# Patient Record
Sex: Female | Born: 1950 | Race: White | Hispanic: No | Smoking: Never smoker
Health system: Southern US, Community
[De-identification: ages and names within clinical notes are randomized; demographics above are authoritative.]

## PROBLEM LIST (undated history)

## (undated) DIAGNOSIS — M549 Dorsalgia, unspecified: Secondary | ICD-10-CM

## (undated) DIAGNOSIS — E559 Vitamin D deficiency, unspecified: Secondary | ICD-10-CM

## (undated) DIAGNOSIS — I10 Essential (primary) hypertension: Secondary | ICD-10-CM

## (undated) DIAGNOSIS — E785 Hyperlipidemia, unspecified: Secondary | ICD-10-CM

## (undated) DIAGNOSIS — M797 Fibromyalgia: Secondary | ICD-10-CM

## (undated) DIAGNOSIS — E119 Type 2 diabetes mellitus without complications: Secondary | ICD-10-CM

## (undated) DIAGNOSIS — Z87442 Personal history of urinary calculi: Secondary | ICD-10-CM

## (undated) HISTORY — DX: Vitamin D deficiency, unspecified: E55.9

## (undated) HISTORY — DX: Type 2 diabetes mellitus without complications: E11.9

## (undated) HISTORY — DX: Personal history of urinary calculi: Z87.442

---

## 1998-06-27 ENCOUNTER — Ambulatory Visit (HOSPITAL_COMMUNITY): Admission: RE | Admit: 1998-06-27 | Discharge: 1998-06-27 | Payer: Self-pay | Admitting: *Deleted

## 1998-07-17 ENCOUNTER — Ambulatory Visit: Admission: RE | Admit: 1998-07-17 | Discharge: 1998-07-17 | Payer: Self-pay | Admitting: *Deleted

## 1998-08-02 ENCOUNTER — Other Ambulatory Visit: Admission: RE | Admit: 1998-08-02 | Discharge: 1998-08-02 | Payer: Self-pay | Admitting: *Deleted

## 1998-11-07 ENCOUNTER — Ambulatory Visit (HOSPITAL_COMMUNITY): Admission: RE | Admit: 1998-11-07 | Discharge: 1998-11-07 | Payer: Self-pay | Admitting: *Deleted

## 1998-11-22 ENCOUNTER — Ambulatory Visit (HOSPITAL_COMMUNITY): Admission: RE | Admit: 1998-11-22 | Discharge: 1998-11-22 | Payer: Self-pay | Admitting: Urology

## 1998-12-13 ENCOUNTER — Encounter: Payer: Self-pay | Admitting: Urology

## 1998-12-31 ENCOUNTER — Ambulatory Visit (HOSPITAL_COMMUNITY): Admission: RE | Admit: 1998-12-31 | Discharge: 1998-12-31 | Payer: Self-pay | Admitting: Urology

## 1999-07-01 ENCOUNTER — Ambulatory Visit (HOSPITAL_COMMUNITY): Admission: RE | Admit: 1999-07-01 | Discharge: 1999-07-01 | Payer: Self-pay | Admitting: *Deleted

## 1999-08-28 ENCOUNTER — Other Ambulatory Visit: Admission: RE | Admit: 1999-08-28 | Discharge: 1999-08-28 | Payer: Self-pay | Admitting: *Deleted

## 1999-12-03 ENCOUNTER — Ambulatory Visit (HOSPITAL_COMMUNITY): Admission: RE | Admit: 1999-12-03 | Discharge: 1999-12-03 | Payer: Self-pay | Admitting: *Deleted

## 2000-08-19 ENCOUNTER — Encounter: Admission: RE | Admit: 2000-08-19 | Discharge: 2000-08-19 | Payer: Self-pay | Admitting: *Deleted

## 2000-09-10 ENCOUNTER — Other Ambulatory Visit: Admission: RE | Admit: 2000-09-10 | Discharge: 2000-09-10 | Payer: Self-pay | Admitting: *Deleted

## 2001-09-14 ENCOUNTER — Other Ambulatory Visit: Admission: RE | Admit: 2001-09-14 | Discharge: 2001-09-14 | Payer: Self-pay | Admitting: Internal Medicine

## 2001-12-17 ENCOUNTER — Ambulatory Visit (HOSPITAL_COMMUNITY): Admission: RE | Admit: 2001-12-17 | Discharge: 2001-12-17 | Payer: Self-pay | Admitting: Gastroenterology

## 2002-09-27 ENCOUNTER — Encounter: Payer: Self-pay | Admitting: Internal Medicine

## 2002-09-27 ENCOUNTER — Ambulatory Visit (HOSPITAL_COMMUNITY): Admission: RE | Admit: 2002-09-27 | Discharge: 2002-09-27 | Payer: Self-pay | Admitting: Internal Medicine

## 2003-08-29 ENCOUNTER — Other Ambulatory Visit: Admission: RE | Admit: 2003-08-29 | Discharge: 2003-08-29 | Payer: Self-pay | Admitting: Obstetrics and Gynecology

## 2004-05-01 ENCOUNTER — Ambulatory Visit (HOSPITAL_COMMUNITY): Admission: RE | Admit: 2004-05-01 | Discharge: 2004-05-01 | Payer: Self-pay | Admitting: Internal Medicine

## 2005-01-20 ENCOUNTER — Encounter: Admission: RE | Admit: 2005-01-20 | Discharge: 2005-04-20 | Payer: Self-pay | Admitting: Internal Medicine

## 2005-09-25 ENCOUNTER — Other Ambulatory Visit: Admission: RE | Admit: 2005-09-25 | Discharge: 2005-09-25 | Payer: Self-pay | Admitting: Internal Medicine

## 2005-09-26 ENCOUNTER — Ambulatory Visit (HOSPITAL_COMMUNITY): Admission: RE | Admit: 2005-09-26 | Discharge: 2005-09-26 | Payer: Self-pay | Admitting: Internal Medicine

## 2006-10-07 ENCOUNTER — Encounter: Admission: RE | Admit: 2006-10-07 | Discharge: 2006-10-07 | Payer: Self-pay | Admitting: Internal Medicine

## 2006-10-20 ENCOUNTER — Encounter: Admission: RE | Admit: 2006-10-20 | Discharge: 2006-10-20 | Payer: Self-pay | Admitting: Internal Medicine

## 2006-10-30 ENCOUNTER — Encounter: Admission: RE | Admit: 2006-10-30 | Discharge: 2006-10-30 | Payer: Self-pay | Admitting: Internal Medicine

## 2006-10-30 ENCOUNTER — Encounter (INDEPENDENT_AMBULATORY_CARE_PROVIDER_SITE_OTHER): Payer: Self-pay | Admitting: Specialist

## 2006-11-16 ENCOUNTER — Ambulatory Visit: Payer: Self-pay | Admitting: Oncology

## 2006-11-27 ENCOUNTER — Encounter: Admission: RE | Admit: 2006-11-27 | Discharge: 2006-11-27 | Payer: Self-pay | Admitting: General Surgery

## 2007-01-15 ENCOUNTER — Ambulatory Visit: Payer: Self-pay | Admitting: Oncology

## 2007-10-27 ENCOUNTER — Encounter: Admission: RE | Admit: 2007-10-27 | Discharge: 2007-10-27 | Payer: Self-pay | Admitting: Internal Medicine

## 2008-04-05 ENCOUNTER — Encounter: Admission: RE | Admit: 2008-04-05 | Discharge: 2008-04-05 | Payer: Self-pay | Admitting: Orthopedic Surgery

## 2009-04-03 ENCOUNTER — Ambulatory Visit (HOSPITAL_COMMUNITY): Admission: RE | Admit: 2009-04-03 | Discharge: 2009-04-03 | Payer: Self-pay | Admitting: Internal Medicine

## 2009-05-28 ENCOUNTER — Other Ambulatory Visit: Admission: RE | Admit: 2009-05-28 | Discharge: 2009-05-28 | Payer: Self-pay | Admitting: Internal Medicine

## 2009-10-22 ENCOUNTER — Encounter: Admission: RE | Admit: 2009-10-22 | Discharge: 2009-10-22 | Payer: Self-pay | Admitting: Obstetrics and Gynecology

## 2009-11-09 ENCOUNTER — Emergency Department (HOSPITAL_COMMUNITY): Admission: EM | Admit: 2009-11-09 | Discharge: 2009-11-09 | Payer: Self-pay | Admitting: Emergency Medicine

## 2010-12-22 ENCOUNTER — Encounter: Payer: Self-pay | Admitting: Internal Medicine

## 2011-04-18 NOTE — Procedures (Signed)
[place]. [facility]  Patient:    [name], [name] Visit Number: [id number] MRN: [id number]          Service Type: END Location: ENDO Attending Physician:  Charna Elizabeth Dictated by:   Anselmo Rod, M.D. Proc. Date: 12/17/01 Admit Date:  12/17/2001 Discharge Date: 12/17/2001   CC:         Lovenia Kim, D.O.   Procedure Report  DATE OF BIRTH:  1951-11-27.  PROCEDURE:  Colonoscopy.  ENDOSCOPIST:  Anselmo Rod, M.D.  INSTRUMENT USED:  Olympus video colonoscope.  INDICATION FOR PROCEDURE:  Rectal bleeding with change in bowel habits in a 60 year old white female.  Rule out colonic polyps, masses, hemorrhoids, etc.  PREPROCEDURE PREPARATION:  Informed consent was procured from the patient. The patient was fasted for eight hours prior to the procedure and prepped with a bottle of magnesium citrate and a gallon of NuLytely the night prior to the procedure.  PREPROCEDURE PHYSICAL:  VITAL SIGNS:  The patient had stable vital signs.  NECK:  Supple.  CHEST:  Clear to auscultation.  S1, S2 regular.  ABDOMEN:  Soft with normal bowel sounds.  DESCRIPTION OF PROCEDURE:  The patient was placed in the left lateral decubitus position and sedated with 50 mg of Demerol and 5 mg of Versed intravenously.  Once the patient was adequately sedate and maintained on low-flow oxygen and continuous cardiac monitoring, the Olympus video colonoscope was advanced from the rectum to the cecum without difficulty.  The entire colonic mucosa appeared healthy and without lesions.  The appendiceal orifice and the ileocecal valve were clearly visualized and photographed.  No masses, polyps, erosions, ulcerations, or diverticula were seen.  Small internal hemorrhoids were appreciated on retroflexion in the rectum.  The patient tolerated the procedure well without complication.  IMPRESSION:  Normal colonoscopy except for small internal  hemorrhoid.  RECOMMENDATIONS: 1. A high-fiber diet has been advised for the patient. 2. Outpatient follow-up is advised in the next four to six weeks or earlier    if need be.  Further recommendations made at that time. Dictated by:   Anselmo Rod, M.D. Attending Physician:  Charna Elizabeth DD:  12/17/01 TD:  12/20/01 Job: [id number]

## 2011-09-08 ENCOUNTER — Other Ambulatory Visit (HOSPITAL_COMMUNITY): Payer: Medicare Other

## 2011-09-09 ENCOUNTER — Encounter (HOSPITAL_COMMUNITY)
Admission: RE | Admit: 2011-09-09 | Discharge: 2011-09-09 | Disposition: A | Discharge status: Home / Self Care | Payer: Medicare Other | Source: Ambulatory Visit | Attending: Orthopedic Surgery | Admitting: Orthopedic Surgery

## 2011-09-09 ENCOUNTER — Other Ambulatory Visit (HOSPITAL_COMMUNITY): Payer: Self-pay | Admitting: Orthopedic Surgery

## 2011-09-09 DIAGNOSIS — M549 Dorsalgia, unspecified: Secondary | ICD-10-CM

## 2011-09-09 LAB — TYPE AND SCREEN
ABO/RH(D): O POS
Antibody Screen: NEGATIVE

## 2011-09-09 LAB — BASIC METABOLIC PANEL
GFR calc Af Amer: >90 mL/min (ref >90)
GFR calc non Af Amer: 90 mL/min — ABNORMAL LOW (ref >90)
Glucose, Bld: 113 mg/dL — ABNORMAL HIGH (ref 70–99)
Potassium: 3.9 mEq/L (ref 3.5–5.1)
Sodium: 140 mEq/L (ref 135–145)

## 2011-09-09 LAB — CBC
Hemoglobin: 12 g/dL (ref 12.0–15.0)
MCHC: 31.6 g/dL (ref 30.0–36.0)
Platelets: 266 10*3/uL (ref 150–400)

## 2011-09-09 LAB — ABO/RH: ABO/RH(D): O POS

## 2011-09-10 LAB — HEPATIC FUNCTION PANEL
ALT: 17 U/L (ref 0–35)
Alkaline Phosphatase: 83 U/L (ref 39–117)
Bilirubin, Direct: <0.1 mg/dL (ref 0.0–0.3)
Total Bilirubin: 0.5 mg/dL (ref 0.3–1.2)

## 2011-09-11 ENCOUNTER — Inpatient Hospital Stay (HOSPITAL_COMMUNITY)
Admission: RE | Admit: 2011-09-11 | Discharge: 2011-09-19 | DRG: 460 | Disposition: A | Discharge status: Skilled Nursing Facility | Payer: Medicare Other | Source: Ambulatory Visit | Attending: Orthopedic Surgery | Admitting: Orthopedic Surgery

## 2011-09-11 ENCOUNTER — Inpatient Hospital Stay (HOSPITAL_COMMUNITY): Payer: Medicare Other

## 2011-09-11 DIAGNOSIS — K219 Gastro-esophageal reflux disease without esophagitis: Secondary | ICD-10-CM | POA: Diagnosis present

## 2011-09-11 DIAGNOSIS — T4275XA Adverse effect of unspecified antiepileptic and sedative-hypnotic drugs, initial encounter: Secondary | ICD-10-CM | POA: Diagnosis not present

## 2011-09-11 DIAGNOSIS — Z7982 Long term (current) use of aspirin: Secondary | ICD-10-CM

## 2011-09-11 DIAGNOSIS — IMO0001 Reserved for inherently not codable concepts without codable children: Secondary | ICD-10-CM | POA: Diagnosis present

## 2011-09-11 DIAGNOSIS — R7989 Other specified abnormal findings of blood chemistry: Secondary | ICD-10-CM | POA: Diagnosis not present

## 2011-09-11 DIAGNOSIS — E119 Type 2 diabetes mellitus without complications: Secondary | ICD-10-CM | POA: Diagnosis present

## 2011-09-11 DIAGNOSIS — Z01818 Encounter for other preprocedural examination: Secondary | ICD-10-CM

## 2011-09-11 DIAGNOSIS — M129 Arthropathy, unspecified: Secondary | ICD-10-CM | POA: Diagnosis present

## 2011-09-11 DIAGNOSIS — R4182 Altered mental status, unspecified: Secondary | ICD-10-CM | POA: Diagnosis not present

## 2011-09-11 DIAGNOSIS — Q762 Congenital spondylolisthesis: Secondary | ICD-10-CM

## 2011-09-11 DIAGNOSIS — E785 Hyperlipidemia, unspecified: Secondary | ICD-10-CM | POA: Diagnosis present

## 2011-09-11 DIAGNOSIS — Z79899 Other long term (current) drug therapy: Secondary | ICD-10-CM

## 2011-09-11 DIAGNOSIS — I10 Essential (primary) hypertension: Secondary | ICD-10-CM | POA: Diagnosis present

## 2011-09-11 DIAGNOSIS — M48061 Spinal stenosis, lumbar region without neurogenic claudication: Principal | ICD-10-CM | POA: Diagnosis present

## 2011-09-11 DIAGNOSIS — D649 Anemia, unspecified: Secondary | ICD-10-CM | POA: Diagnosis present

## 2011-09-11 DIAGNOSIS — IMO0002 Reserved for concepts with insufficient information to code with codable children: Secondary | ICD-10-CM

## 2011-09-11 DIAGNOSIS — Z01812 Encounter for preprocedural laboratory examination: Secondary | ICD-10-CM

## 2011-09-11 LAB — URINE MICROSCOPIC-ADD ON

## 2011-09-11 LAB — DIFFERENTIAL
Basophils Absolute: 0 10*3/uL (ref 0.0–0.1)
Basophils Relative: 0 % (ref 0–1)
Eosinophils Absolute: 0.1 10*3/uL (ref 0.0–0.7)
Eosinophils Relative: 2 % (ref 0–5)
Monocytes Absolute: 0.6 10*3/uL (ref 0.1–1.0)

## 2011-09-11 LAB — PROTIME-INR: INR: 1.02 (ref 0.00–1.49)

## 2011-09-11 LAB — URINALYSIS, ROUTINE W REFLEX MICROSCOPIC
Glucose, UA: NEGATIVE mg/dL
Hgb urine dipstick: NEGATIVE
Ketones, ur: 15 mg/dL — AB
Protein, ur: NEGATIVE mg/dL
Urobilinogen, UA: 1 mg/dL (ref 0.0–1.0)

## 2011-09-11 LAB — CBC
Hemoglobin: 11 g/dL — ABNORMAL LOW (ref 12.0–15.0)
MCH: 27.3 pg (ref 26.0–34.0)
MCHC: 31.6 g/dL (ref 30.0–36.0)
RDW: 14.9 % (ref 11.5–15.5)

## 2011-09-11 LAB — GLUCOSE, CAPILLARY

## 2011-09-11 LAB — APTT: aPTT: 30 seconds (ref 24–37)

## 2011-09-12 LAB — GLUCOSE, CAPILLARY
Glucose-Capillary: 167 mg/dL — ABNORMAL HIGH (ref 70–99)
Glucose-Capillary: 150 mg/dL — ABNORMAL HIGH (ref 70–99)

## 2011-09-13 LAB — GLUCOSE, CAPILLARY
Glucose-Capillary: 155 mg/dL — ABNORMAL HIGH (ref 70–99)
Glucose-Capillary: 156 mg/dL — ABNORMAL HIGH (ref 70–99)

## 2011-09-14 ENCOUNTER — Inpatient Hospital Stay (HOSPITAL_COMMUNITY): Payer: Medicare Other

## 2011-09-14 LAB — COMPREHENSIVE METABOLIC PANEL
ALT: 973 U/L — ABNORMAL HIGH (ref 0–35)
AST: 2073 U/L — ABNORMAL HIGH (ref 0–37)
Albumin: 2.8 g/dL — ABNORMAL LOW (ref 3.5–5.2)
Albumin: 2.8 g/dL — ABNORMAL LOW (ref 3.5–5.2)
Alkaline Phosphatase: 65 U/L (ref 39–117)
Alkaline Phosphatase: 64 U/L (ref 39–117)
BUN: 26 mg/dL — ABNORMAL HIGH (ref 6–23)
BUN: 25 mg/dL — ABNORMAL HIGH (ref 6–23)
Chloride: 100 mEq/L (ref 96–112)
Chloride: 101 mEq/L (ref 96–112)
Potassium: 3.8 mEq/L (ref 3.5–5.1)
Potassium: 3.4 mEq/L — ABNORMAL LOW (ref 3.5–5.1)
Total Bilirubin: 0.4 mg/dL (ref 0.3–1.2)
Total Bilirubin: 0.5 mg/dL (ref 0.3–1.2)

## 2011-09-14 LAB — CBC
HCT: 29.4 % — ABNORMAL LOW (ref 36.0–46.0)
Hemoglobin: 9.1 g/dL — ABNORMAL LOW (ref 12.0–15.0)
RDW: 15.1 % (ref 11.5–15.5)
WBC: 8.3 10*3/uL (ref 4.0–10.5)

## 2011-09-14 LAB — URINALYSIS, ROUTINE W REFLEX MICROSCOPIC
Glucose, UA: NEGATIVE mg/dL
Ketones, ur: 15 mg/dL — AB
Protein, ur: 30 mg/dL — AB

## 2011-09-14 LAB — URINE MICROSCOPIC-ADD ON

## 2011-09-14 LAB — GLUCOSE, CAPILLARY
Glucose-Capillary: 165 mg/dL — ABNORMAL HIGH (ref 70–99)
Glucose-Capillary: 174 mg/dL — ABNORMAL HIGH (ref 70–99)
Glucose-Capillary: 136 mg/dL — ABNORMAL HIGH (ref 70–99)

## 2011-09-15 ENCOUNTER — Inpatient Hospital Stay (HOSPITAL_COMMUNITY): Payer: Medicare Other

## 2011-09-15 LAB — COMPREHENSIVE METABOLIC PANEL
BUN: 22 mg/dL (ref 6–23)
CO2: 32 mEq/L (ref 19–32)
Chloride: 100 mEq/L (ref 96–112)
Creatinine, Ser: 0.63 mg/dL (ref 0.50–1.10)
GFR calc Af Amer: >90 mL/min (ref >90)
GFR calc non Af Amer: >90 mL/min (ref >90)
Total Bilirubin: 0.6 mg/dL (ref 0.3–1.2)

## 2011-09-15 LAB — IGM: IgM, Serum: 379 mg/dL — ABNORMAL HIGH (ref 52–322)

## 2011-09-15 LAB — ANTI-SMOOTH MUSCLE ANTIBODY, IGG: F-Actin IgG: 2 U (ref <20)

## 2011-09-15 LAB — URINE CULTURE

## 2011-09-15 LAB — IRON AND TIBC: UIBC: 221 ug/dL (ref 125–400)

## 2011-09-15 LAB — ANA: Anti Nuclear Antibody(ANA): NEGATIVE

## 2011-09-15 LAB — GLUCOSE, CAPILLARY: Glucose-Capillary: 150 mg/dL — ABNORMAL HIGH (ref 70–99)

## 2011-09-15 LAB — HEPATITIS PANEL, ACUTE: Hep A IgM: NEGATIVE

## 2011-09-16 LAB — COMPREHENSIVE METABOLIC PANEL
ALT: 1285 U/L — ABNORMAL HIGH (ref 0–35)
Albumin: 2.8 g/dL — ABNORMAL LOW (ref 3.5–5.2)
Alkaline Phosphatase: 70 U/L (ref 39–117)
Potassium: 3.2 mEq/L — ABNORMAL LOW (ref 3.5–5.1)
Sodium: 140 mEq/L (ref 135–145)
Total Protein: 6 g/dL (ref 6.0–8.3)

## 2011-09-16 LAB — PROTIME-INR
INR: 1.18 (ref 0.00–1.49)
Prothrombin Time: 15.3 seconds — ABNORMAL HIGH (ref 11.6–15.2)

## 2011-09-16 LAB — MITOCHONDRIAL ANTIBODIES: Mitochondrial M2 Ab, IgG: 0.09 (ref <0.91)

## 2011-09-17 LAB — COMPREHENSIVE METABOLIC PANEL
AST: 426 U/L — ABNORMAL HIGH (ref 0–37)
Alkaline Phosphatase: 68 U/L (ref 39–117)
BUN: 14 mg/dL (ref 6–23)
CO2: 29 mEq/L (ref 19–32)
Chloride: 103 mEq/L (ref 96–112)
Creatinine, Ser: 0.65 mg/dL (ref 0.50–1.10)
GFR calc non Af Amer: >90 mL/min (ref >90)
Potassium: 3.6 mEq/L (ref 3.5–5.1)
Total Bilirubin: 0.7 mg/dL (ref 0.3–1.2)

## 2011-09-18 LAB — GLUCOSE, CAPILLARY
Glucose-Capillary: 87 mg/dL (ref 70–99)
Glucose-Capillary: 93 mg/dL (ref 70–99)
Glucose-Capillary: 138 mg/dL — ABNORMAL HIGH (ref 70–99)

## 2011-09-18 LAB — COMPREHENSIVE METABOLIC PANEL
Albumin: 2.7 g/dL — ABNORMAL LOW (ref 3.5–5.2)
Alkaline Phosphatase: 69 U/L (ref 39–117)
BUN: 15 mg/dL (ref 6–23)
Calcium: 10.1 mg/dL (ref 8.4–10.5)
Creatinine, Ser: 0.7 mg/dL (ref 0.50–1.10)
GFR calc Af Amer: >90 mL/min (ref >90)
Potassium: 3.9 mEq/L (ref 3.5–5.1)
Total Protein: 5.8 g/dL — ABNORMAL LOW (ref 6.0–8.3)

## 2011-09-18 NOTE — Op Note (Signed)
[name], [name]               ACCOUNT NO.:  [id number]  MEDICAL RECORD NO.:  [id number]  LOCATION:  5003                         FACILITY:  [facility]  PHYSICIAN:  Estill Bamberg, MD      DATE OF BIRTH:  [birth year]  DATE OF PROCEDURE:  09/11/2011 DATE OF DISCHARGE:                              OPERATIVE REPORT   PREOPERATIVE DIAGNOSES: 1. L4-L5 spinal stenosis causing right-sided leg pain. 2. L4-L5 grade 1 spondylolisthesis.  POSTOPERATIVE DIAGNOSES: 1. L4-L5 spinal stenosis causing right-sided leg pain. 2. L4-L5 grade 1 spondylolisthesis.  PROCEDURES: 1. Right-sided transforaminal lumbar interbody fusion, L4-L5. 2. Left-sided posterolateral fusion, L4-L5. 3. Placement of posterior instrumentation, L4, L5. 4. Insertion of interbody device, L4-L5 (10-mm lordotic Concorde     Bullet cage). 5. Use of local autograft. 6. Bone marrow aspiration from a separate incision from the patient's     left iliac crest. 7. Intraoperative use of fluoroscopy.  SURGEON:  Estill Bamberg, MD  ASSISTANTS:  Laural Benes. Su Hilt, [place]  ANESTHESIA:  General endotracheal anesthesia.  COMPLICATIONS:  None.  DISPOSITION:  Stable.  ESTIMATED BLOOD LOSS:  300 mL.  INDICATIONS FOR PROCEDURE:  Briefly, [name] is an extremely pleasant 60 year old female who initially presented to my office on August 01, 2011 with a 4-year history of back pain and 1-year history of pain in the right leg.  The patient's pain was in the distribution of the L5 nerve.  The patient did have an MRI which was notable for spinal stenosis at the L4-L5 level with disk protrusion which was likely causing compression of the traversing L5 nerve.  The patient did have an L5 selective nerve root block and she did state that this did temporarily alleviate 100% of her symptoms.  Given her failure of extensive conservative care, we did have a discussion regarding going forward with a right-sided L4-L5 transforaminal lumbar interbody  fusion as well as remainder of the procedure noted above.  The patient fully understood the risks and limitations of the procedure as outlined in my preoperative note.  OPERATIVE DETAILS:  On September 11, 2011, the patient was brought to Surgery and general endotracheal anesthesia was administered.  The patient was placed prone on a well-padded time flat Jackson bed with a Wilson frame.  All bony prominences were meticulously padded.  SCDs were placed and antibiotics were given.  A time-out procedure was performed. The back was then prepped and draped in the usual sterile fashion.  I did place two 18-gauge spinal needles over the midline approximately at the L4-L5 level and a lateral intraoperative radiograph was obtained in order to identify the appropriate trajectory of the pedicle screws and to help optimize location of the incision.  I then made an incision from approximately spinous process of L3 to approximately spinous process of L5.  The fascia was sharply incised.  The paraspinal musculature was gently swept laterally.  The posterolateral gutters were thoroughly exposed out to the transverse processes of L4 and L5.  Of note, the lateral fluoroscopy was brought in which did confirm the appropriate operative level.  Once the posterolateral gutters were fully exposed, I went forward with cannulating the pedicles of L4 and L5 on  the right side.  This was done using a 4-mm bur followed by curved gearshift probe followed by a ball-tipped probe and a 6-mm tap.  A ball-tip probe was again utilized to confirm no cortical violation of the pedicles.  Bone wax was placed.  This was done on the right side and then on the left. AP and lateral fluoroscopy was used throughout to help optimize the location of the screw positioning.  The posterolateral gutters were then packed and I turned my attention towards the decompressive aspect of the procedure.  I did use an osteotome to entirely remove  the inferior articular process of L4.  The ligamentum flavum was teased away and was removed using a series of Kerrisons and the superior articular process of L5 was also removed.  The L4-L5 intervertebral disk was readily identified.  There was epidural bleeding encountered which was controlled using Gelfoam, thrombin as well as bipolar electrocautery. The traversing L5 nerve root was gently swept medially and the exiting L4 nerve was identified and retracted superiorly.  With an assistant holding medial retraction of the traversing L5 nerve, I did use a #15 blade knife to perform an annulotomy at the posterolateral aspect of the disk.  I then went forward with a thorough and meticulous diskectomy using a series of curettes and pituitary rongeurs.  I was very pleased with the final diskectomy.  I then went forward placing a series of interbody trials.  I did feel that a 10-mm lordotic trial would be the most appropriate fit.  At this point, the retractors were removed and the wound was copiously irrigated.  I then turned my attention towards the patient's left iliac crest and 7 mL of bone marrow aspirate was obtained through a separate incision.  This was mixed with 10 mL aliquot of Vitoss BA.  Autograft obtained from removing the facet was cleaned and broken into small pieces.  The autograft and Vitoss mixture was mixed and packed into the interbody device.  With distraction applied using a lamina spreader, I did tamped a 10-mm Bullet cage into the interbody space.  AP and lateral fluoroscopy was utilized to confirm the appropriate position.  Of note, prior to placing the interbody device, I did thoroughly packed the interbody space with approximately 4 mL of the Vitoss BA/bone marrow aspirate/autograft mixture.  I was very happy with the final press-fit of the interbody cage.  All of the dural bleeding was again identified and controlled.  I then placed 7 x 45 mm screws on the right  side.  Triggered EMG was utilized to test the screws and there was no motor-evoked potential less 25 mA.  A 35-mm rod was placed. Final to putting caps, the Wilson frame was cranked down, so as to optimize the amount of lordosis.  A 35-mm rod was then placed and caps were placed.  A compression maneuver was placed across the 35-mm rod and a final locking procedure was performed.  At this point again, the wound was copiously irrigated with approximately 1 L of normal saline.  I then turned my attention towards the patient's left side.  I did use a 4-mm bur to entirely and thoroughly decorticate the posterior elements of L4 and L5 in addition to the facet joint in addition to the transverse processes.  The remainder of the Vitoss BA/bone marrow aspirate/autograft mixture was placed along the posterior elements and posterolateral gutter.  The 7 x 45 mm screws were again placed and were again tested.  There  was no triggered EMG response less than 25 mA.  A 35-mm rod was placed and caps were then applied.  A compression maneuver was placed across the rod and a final locking procedure was performed. I then again obtained AP and lateral fluoroscopic views and I was extremely pleased with the appearance of the final construct.  I then again explored the epidural space and all epidural superficial and deep bleeding was entirely controlled.  A drain was not placed.  I then used #1 Vicryl to close the fascia.  The subcutaneous layer was closed using 2-0 Vicryl and the skin was closed using 3-0 Monocryl.  The incision overlying the iliac crest, bone marrow harvest area was also irrigated and closed using 3-0 Monocryl.  All instrument counts were correct at the termination of the procedure.  Of note, Skip Mayer was my assistant throughout the procedure and aided in essential retraction and suctioning required throughout the surgery.     Estill Bamberg, MD     MD/MEDQ  D:  09/11/2011  T:   09/11/2011  Job:  [id number]  cc:   Lovenia Kim, D.O.  Electronically Signed by Estill Bamberg  on 09/18/2011 12:19:21 PM

## 2011-09-19 LAB — COMPREHENSIVE METABOLIC PANEL WITH GFR
ALT: 346 U/L — ABNORMAL HIGH (ref 0–35)
AST: 68 U/L — ABNORMAL HIGH (ref 0–37)
Albumin: 2.8 g/dL — ABNORMAL LOW (ref 3.5–5.2)
Alkaline Phosphatase: 64 U/L (ref 39–117)
BUN: 14 mg/dL (ref 6–23)
CO2: 27 meq/L (ref 19–32)
Calcium: 9.9 mg/dL (ref 8.4–10.5)
Chloride: 102 meq/L (ref 96–112)
Creatinine, Ser: 0.66 mg/dL (ref 0.50–1.10)
GFR calc Af Amer: >90 mL/min
GFR calc non Af Amer: >90 mL/min
Glucose, Bld: 107 mg/dL — ABNORMAL HIGH (ref 70–99)
Potassium: 3.6 meq/L (ref 3.5–5.1)
Sodium: 140 meq/L (ref 135–145)
Total Bilirubin: 0.5 mg/dL (ref 0.3–1.2)
Total Protein: 6 g/dL (ref 6.0–8.3)

## 2011-09-19 LAB — GLUCOSE, CAPILLARY: Glucose-Capillary: 100 mg/dL — ABNORMAL HIGH (ref 70–99)

## 2011-09-26 NOTE — Discharge Summary (Signed)
[name], [name]               ACCOUNT NO.:  [id number]  MEDICAL RECORD NO.:  [id number]  LOCATION:  5003                         FACILITY:  [facility]  PHYSICIAN:  Estill Bamberg, MD      DATE OF BIRTH:  [birth year]  DATE OF ADMISSION:  09/11/2011 DATE OF DISCHARGE:  09/19/2011                              DISCHARGE SUMMARY   ADMISSION DIAGNOSES: 1. L4-5 stenosis causing severe right leg pain. 2. Grade 1 L4-5 spondylolisthesis.  DISCHARGE DIAGNOSIS: 1. L4-5 spinal stenosis causing right-sided leg pain. 2. Grade 1 L4-5 spondylolisthesis. 3. Elevated liver function tests, etiology unclear, worked up by     gastroenterologist  with a resultant decrease in liver function     enzymes. 4. Altered mental status, immediately after surgery, likely related to     elevated liver enzymes and narcotic pain medications, resolved at     the time of the patient's discharge.  ADMISSION HISTORY:  Briefly, [name] is an extremely pleasant 60- year-old female who presented to my office with a 4-year history of pain in her back and 1-year history of pain in her right leg.  I did review the patient's MRI which was notable for spinal stenosis at the L4-5 level.  We did go forward with conservative care, which did temporarily alleviate her symptoms though she did continue to have severe pain.  We therefore did have a discussion regarding going forward with an L4-5 transforaminal lumbar interbody fusion.  The patient was therefore admitted on September 10, 2001, for the procedure noted above.  HOSPITAL COURSE:  On September 11, 2011, the patient was brought to surgery and underwent the procedure noted above.  The patient tolerated the procedure well and was transferred to recovery in stable condition. The patient did well immediately postoperatively, but did continue to complain of residual discomfort in the right leg.  Also of particular note, the patient's mental status was significantly altered,  which was noted on postop day #2 and 3.  Complete metabolic profile was subsequently obtained and was notable for a significant increase in her liver enzymes.  Of particular note, a September 14, 2011, her ALT was noted to be 973 and her AST was noted to be 2073.  For this reason, Gastroenterology was consulted and did evaluate the patient.  I did have multiple telephone discussions with the gastroenterology team and it was felt that the patient will improve over time, which the patient did.  In addition, her liver function enzymes did decreased over time.  On the day of the patient's discharge, her AST did decrease to 68 and ALT did decrease to 346.  I did evaluate the patient personally on the morning of discharge and her mental status was back to baseline.  She did continue to complain of residual pain in the right leg, so we did make a decision to follow this over time.  The patient was uneventfully transferred to a skilled nursing facility on September 19, 2011.  BRIEF DISCHARGE INSTRUCTIONS:  The patient will continue to adhere the back precautions at all times.  She will take 5 mg of OxyContin IR as needed.  DISCHARGE MEDICATIONS: 1. OxyContin IR 5 mg tabs  q.6 hours p.r.n. pain. 2. Metoprolol XL 25 mg 1 p.o. daily. 3. Iron 65 mg 1 tab p.o. daily. 4. Crestor 20 mg 1 p.o. daily. 5. Sertraline 100 mg 1 p.o. daily. 6. Januvia 100 mg 1 p.o. daily. 7. Quinapril/hydrochlorothiazide 20/12.5 one p.o. daily. 8. Probiotic 1 tab p.o. daily. 9. Xanax 0.5 mg 2 p.o. daily. 10.Pioglitazone 30 mg 1 p.o. daily.  The patient will follow up in my office in approximately 1 week after her discharge.     Estill Bamberg, MD     MD/MEDQ  D:  09/19/2011  T:  09/19/2011  Job:  [id number]  Electronically Signed by Estill Bamberg  on 09/26/2011 05:33:41 PM

## 2011-10-02 NOTE — Consult Note (Signed)
NAMEHARLEY, MCCARTNEY NO.:  [id number]  MEDICAL RECORD NO.:  [id number]  LOCATION:  5003                         FACILITY:  [facility]  PHYSICIAN:  Willis Modena, MD     DATE OF BIRTH:  15-Aug-1951  DATE OF CONSULTATION:  09/14/2011 DATE OF DISCHARGE:                                CONSULTATION   REASON FOR CONSULTATION:  Elevated liver tests, altered mental status.  CONSULTING PHYSICIAN:  Estill Bamberg, MD with Orthopedics.  CHIEF COMPLAINT:  Elevated LFTs.  HISTORY OF PRESENT ILLNESS:  [name] is a 60 year old female with multiple medical problems including hypertension, hyperlipidemia, and diabetes.  She is 3 days postop from surgery for lumbar spinal stenosis and spondylolisthesis.  She reportedly was doing fairly well until today when she had an acute change in mental status.  She is oriented to place, but is not oriented otherwise and can provide no meaningful history.  She was found to have markedly elevated liver test today with an AST of over 2000 and an ALT of over 900; her liver tests 5 days ago preoperatively were normal.  According to discussion with nursing and the primary team, she has had no prior history of liver troubles.  She reportedly has not consumed any large amount of narcotics or acetaminophen containing products.  There has been no reported abdominal pain, nausea, vomiting, hematemesis, melena, or hematochezia.  She is running a low-grade temperature of unclear etiology.  PAST MEDICAL HISTORY:  Per chart hypertension, hyperlipidemia, diabetes, tonsillectomy, fibromyalgia, GERD.  PAST SURGICAL HISTORY:  She had lumbar surgery few days ago, unclear of her other surgeries.  She has had tonsillectomy and hysterectomy but unable to confirm with the patient.  SOCIAL HISTORY:  Apparently lives alone, retired, unable to obtain further due to altered mental status.  FAMILY HISTORY:  Unable to obtain due to her altered mental  status.  MEDICATIONS:  Per chart quinapril/hydrochlorothiazide, Zoloft, Xanax, metoprolol, Januvia, Crestor, iron, aspirin 81 mg a day.  ALLERGIES:  No known determined allergies per chart.  REVIEW OF SYSTEMS:  Unable to obtain due to the patient's altered mental status.  PHYSICAL EXAMINATION:  VITAL SIGNS:  T max 100.0, systolic blood pressure is in the 90s-100s.  Most of the time there are few sporadic readings in the 160 systolic, heart rate from the 16X-096, oxygen saturation 94% on room air. GENERAL:  [name] is in no acute distress. NEUROLOGIC:  She is oriented to place but not person, time, or situation.  She is unable to answer questions.  She is unable to follow some commands.  She is able to extend her arm and I did not extend her arms for prolonged periods of time and I did not see any evidence of asterixis. NECK:  Thick and supple. LUNGS:  Poor breath sounds due to patient's size.  No obvious rales or rhonchi. HEART:  Regular but tachycardic, distant heart sounds. ABDOMEN:  Soft, protuberant.  No obvious liver or splenic enlargement. Bowel sounds are present but hypoactive.  No obvious bulging flanks to suggest ascites. EXTREMITIES:  No peripheral cyanosis, clubbing, or edema. SKIN:  No obvious rash or ecchymoses.  RADIOLOGIC STUDIES:  Head CT  shows no acute intracranial abnormalities.  LABORATORY STUDIES:  White count 8.3 hemoglobin 9.1, platelet count is 190.  Sodium 138, potassium 3.8, chloride 100 bicarb 28, BUN 26, creatinine 0.78.  Total protein, total bilirubin, alk phos all normal. Albumin somewhat low at 2.8, AST 2073, ALT 973.  IMPRESSION:  [name] is a 60 year old female, 3 days postop back surgery with acute mental status changes and elevated liver tests. Given the patient's history of hypertension, diabetes, and hyperlipidemia, she may have a history of chronic nonalcoholic steatohepatitis, despite her preoperative normal liver function  tests. If so, she would be at risk for possible hepatic decompensation postoperatively.  Certainly her relative hypotension could predispose her to ischemic hepatitis.  It would be highly unlikely for her to acquire viral hepatitis in the midst of her hospital stay here.  Acute medicine toxicity such as from acetaminophen seems less likely as well given her acute change in liver test while hospitalized.  Some type of anesthetic related hepatitis is certainly also something to consider. Autoimmune hepatitis have been known to cause liver test elevations in this range but would be highly unusual to progress while in the hospital and in the midst of normal liver test a few days ago.  Her lack of abdominal pain argues against some other acute vascular process such as Budd-Chiari syndrome.  PLAN: 1. Would obtain an abdominal ultrasound. 2. We will repeat her liver tests again to see if they have changed     from earlier today. 3. I will obtain acute hepatitis panel, iron panel, and other     evaluation for elevated liver tests. 4. I would start lactulose empirically once she is able to take     medications by mouth. 5. We will follow closely along with you. 6. I have discussed my thoughts with Marshia Ly, the physical     assistant who called in the consult for [name].  Thanks for allowing to participate in [name]'s care.     Willis Modena, MD     WO/MEDQ  D:  09/14/2011  T:  09/15/2011  Job:  [id number]  Electronically Signed by Willis Modena  on 10/02/2011 03:25:28 PM

## 2012-02-13 ENCOUNTER — Other Ambulatory Visit: Payer: Self-pay | Admitting: Gastroenterology

## 2012-02-13 LAB — HM COLONOSCOPY

## 2012-10-09 IMAGING — CT CT HEAD W/O CM
1 of 2 series · 16 of 30 positions shown, 20 images · non-contrast
Comparison: 11/09/2009

CLINICAL DATA: Disoriented

CT HEAD WITHOUT CONTRAST
TECHNIQUE: Contiguous axial images were obtained from the base of
the skull through the vertex without contrast.

[Series 3: head trauma 2.4 h60s · axial · 0.46mm/px · z∈[-98,+62]mm · 16 of 72 slices shown, 20 images]
[im 4/72  brain]
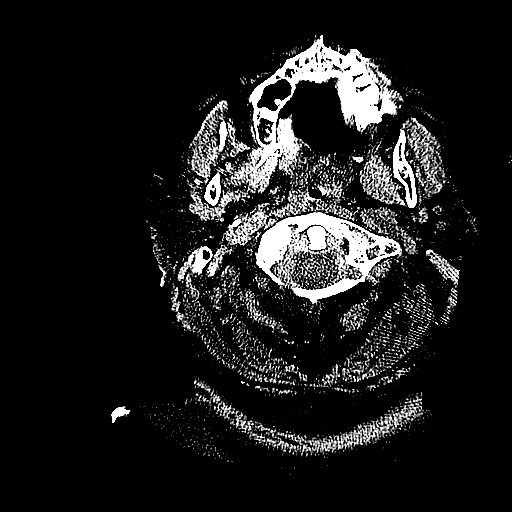
[im 4/72  bone]
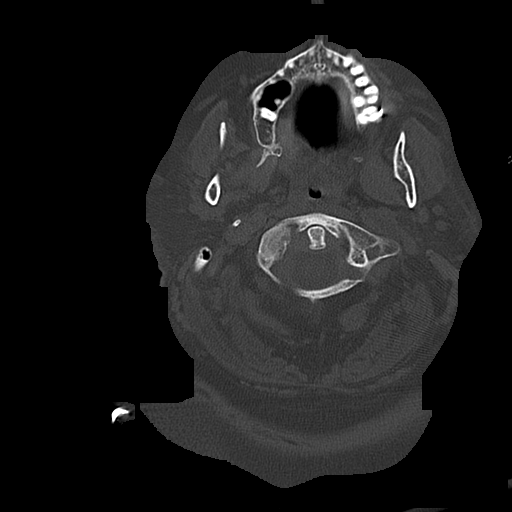
[im 8/72  brain]
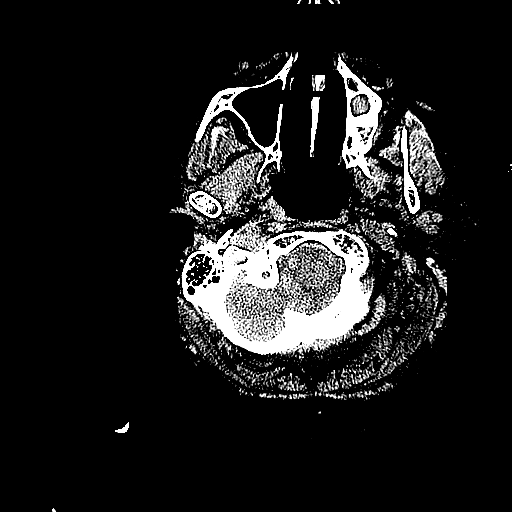
[im 12/72  brain]
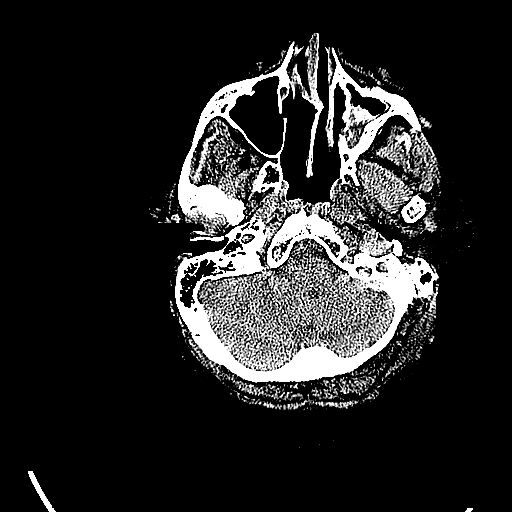
[im 15/72  brain]
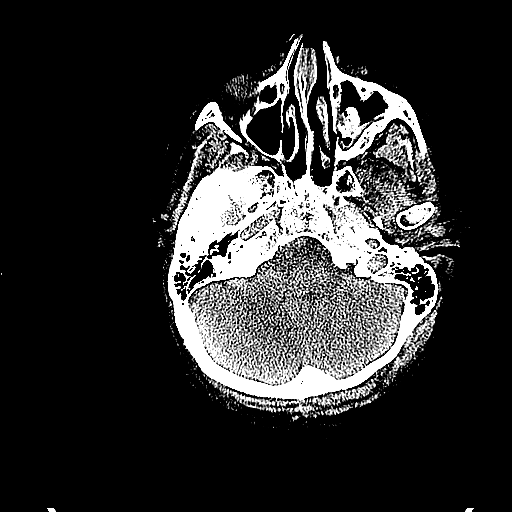
[im 23/72  brain]
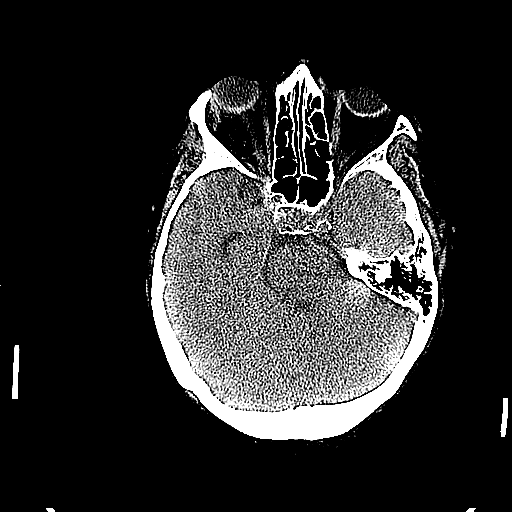
[im 23/72  bone]
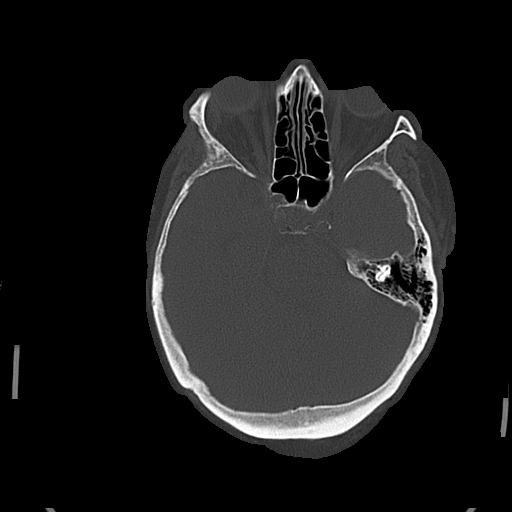
[im 27/72  brain]
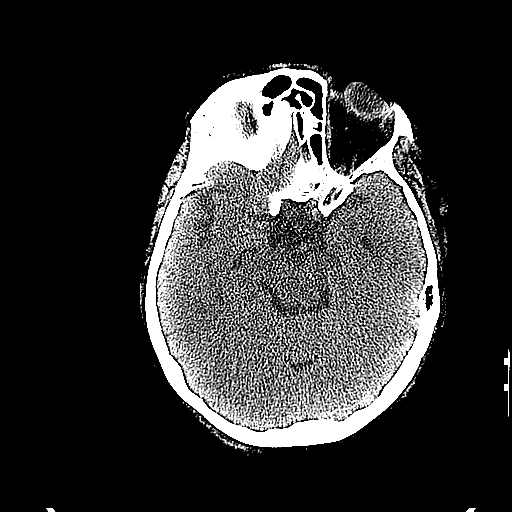
[im 30/72  brain]
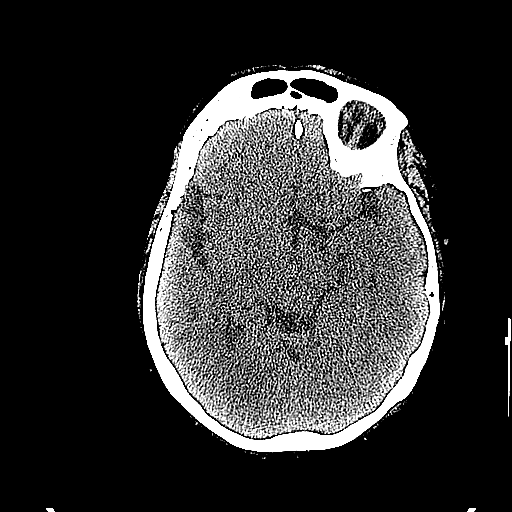
[im 34/72  brain]
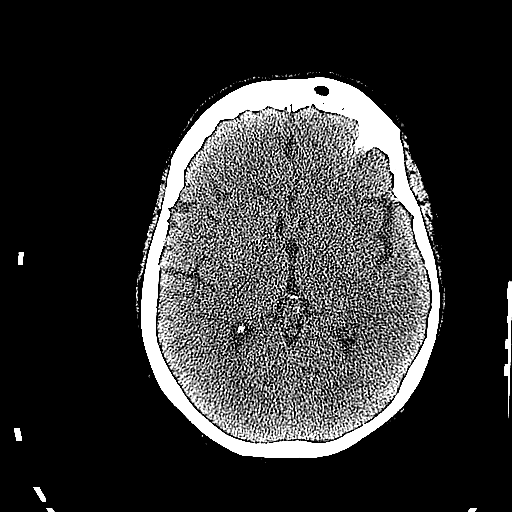
[im 38/72  brain]
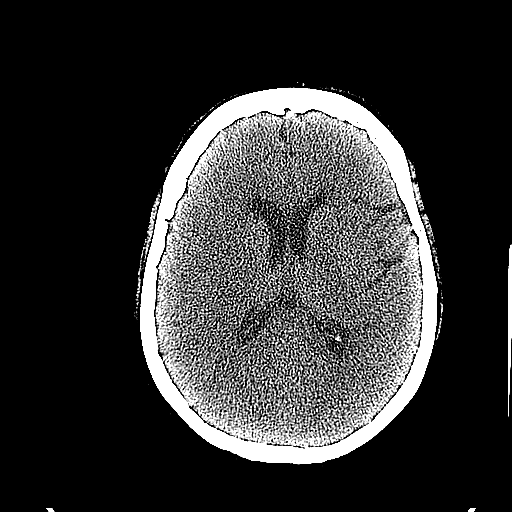
[im 38/72  bone]
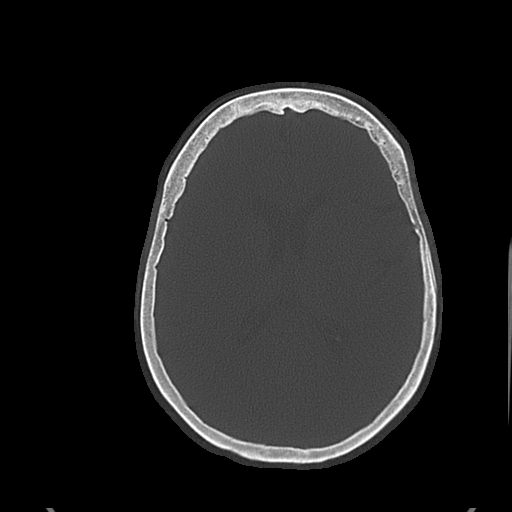
[im 42/72  brain]
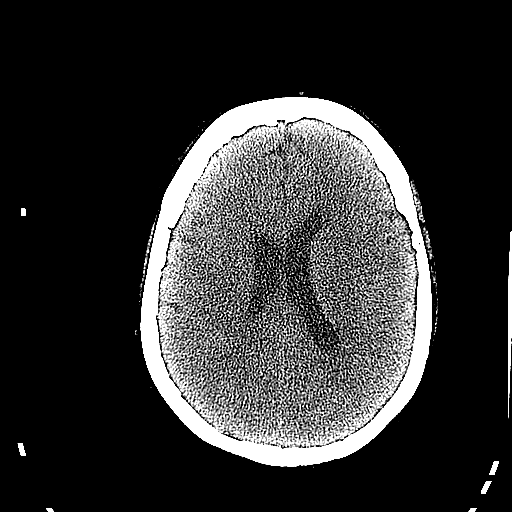
[im 45/72  brain]
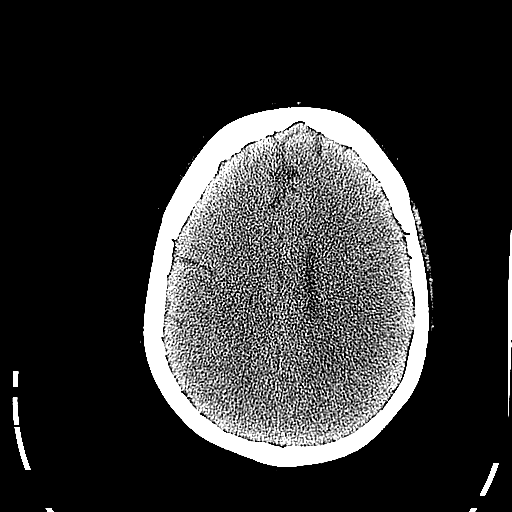
[im 49/72  brain]
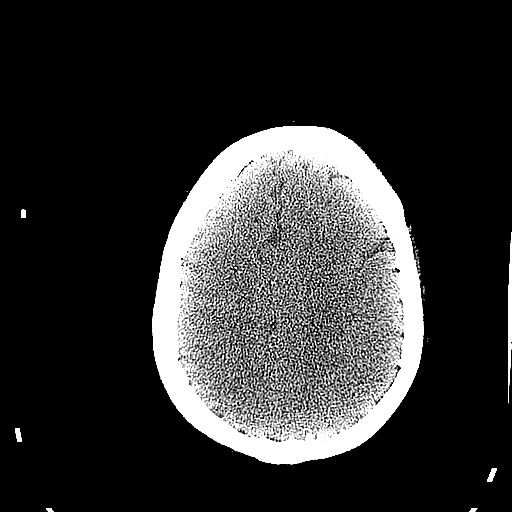
[im 57/72  brain]
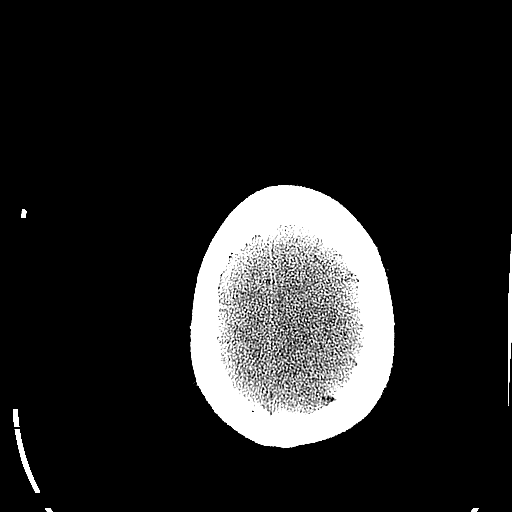
[im 57/72  bone]
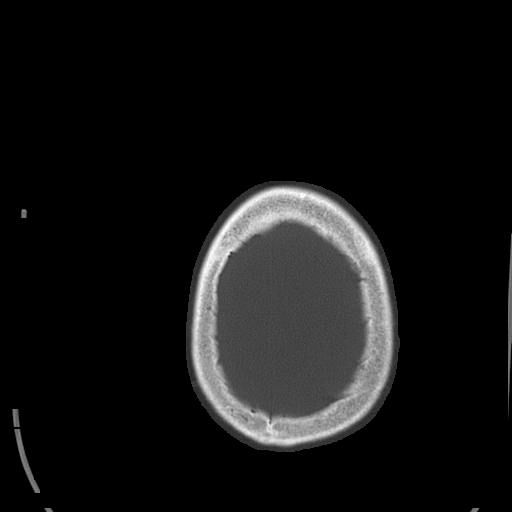
[im 60/72  brain]
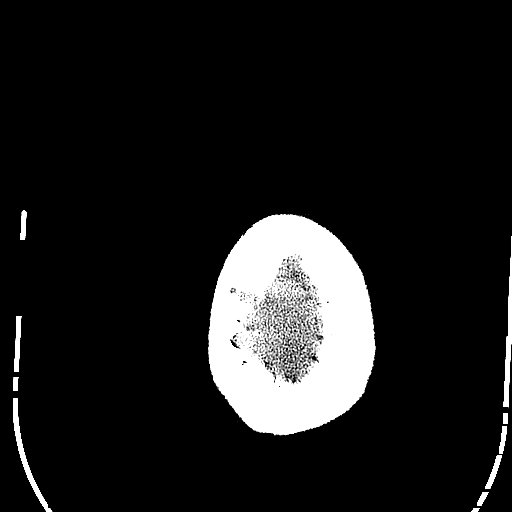
[im 64/72  brain]
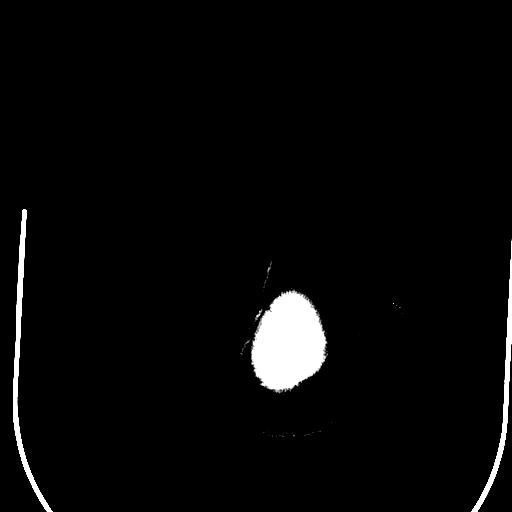
[im 68/72  brain]
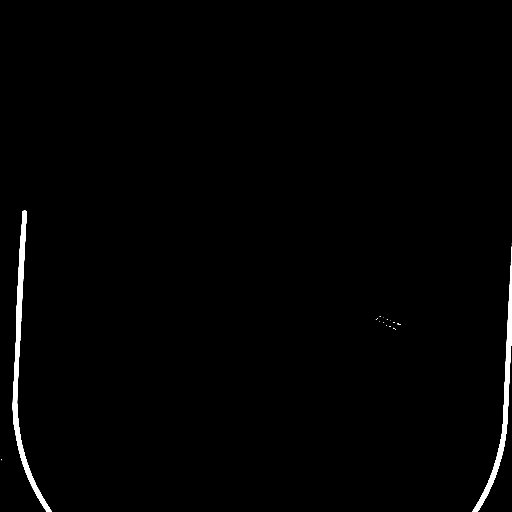

[16 of 30 positions shown; findings below may reference images not displayed]

FINDINGS: There is no evidence for acute hemorrhage, hydrocephalus, mass
lesion, or abnormal extra-axial fluid collection.  No definite CT
evidence for acute infarction.  Chronic mucosal disease is seen in
the left maxillary sinus.  Remaining portions of the visualized
paranasal sinuses are clear.
IMPRESSION: No acute intracranial abnormality.

Chronic mucosal disease in the left maxillary sinus is stable.

## 2012-11-17 ENCOUNTER — Other Ambulatory Visit (HOSPITAL_COMMUNITY): Payer: Self-pay | Admitting: Internal Medicine

## 2012-11-17 DIAGNOSIS — Z1231 Encounter for screening mammogram for malignant neoplasm of breast: Secondary | ICD-10-CM

## 2012-11-30 ENCOUNTER — Ambulatory Visit (HOSPITAL_COMMUNITY)
Admission: RE | Admit: 2012-11-30 | Discharge: 2012-11-30 | Disposition: A | Discharge status: Home / Self Care | Payer: Medicare Other | Source: Ambulatory Visit | Attending: Internal Medicine | Admitting: Internal Medicine

## 2012-11-30 DIAGNOSIS — Z1231 Encounter for screening mammogram for malignant neoplasm of breast: Secondary | ICD-10-CM | POA: Insufficient documentation

## 2012-11-30 LAB — HM MAMMOGRAPHY: HM Mammogram: NORMAL

## 2013-09-28 ENCOUNTER — Emergency Department (HOSPITAL_COMMUNITY): Payer: Medicare Other

## 2013-09-28 ENCOUNTER — Encounter (HOSPITAL_COMMUNITY): Payer: Self-pay | Admitting: Emergency Medicine

## 2013-09-28 ENCOUNTER — Inpatient Hospital Stay (HOSPITAL_COMMUNITY)
Admission: EM | Admit: 2013-09-28 | Discharge: 2013-10-03 | DRG: 481 | Disposition: A | Discharge status: Skilled Nursing Facility | Payer: Medicare Other | Attending: Internal Medicine | Admitting: Internal Medicine

## 2013-09-28 DIAGNOSIS — E785 Hyperlipidemia, unspecified: Secondary | ICD-10-CM | POA: Diagnosis present

## 2013-09-28 DIAGNOSIS — I10 Essential (primary) hypertension: Secondary | ICD-10-CM | POA: Diagnosis present

## 2013-09-28 DIAGNOSIS — S72142A Displaced intertrochanteric fracture of left femur, initial encounter for closed fracture: Secondary | ICD-10-CM

## 2013-09-28 DIAGNOSIS — S72002D Fracture of unspecified part of neck of left femur, subsequent encounter for closed fracture with routine healing: Secondary | ICD-10-CM

## 2013-09-28 DIAGNOSIS — Z79899 Other long term (current) drug therapy: Secondary | ICD-10-CM

## 2013-09-28 DIAGNOSIS — S72143A Displaced intertrochanteric fracture of unspecified femur, initial encounter for closed fracture: Principal | ICD-10-CM | POA: Diagnosis present

## 2013-09-28 DIAGNOSIS — IMO0001 Reserved for inherently not codable concepts without codable children: Secondary | ICD-10-CM | POA: Diagnosis present

## 2013-09-28 DIAGNOSIS — S72309A Unspecified fracture of shaft of unspecified femur, initial encounter for closed fracture: Secondary | ICD-10-CM | POA: Diagnosis present

## 2013-09-28 DIAGNOSIS — W010XXA Fall on same level from slipping, tripping and stumbling without subsequent striking against object, initial encounter: Secondary | ICD-10-CM | POA: Diagnosis present

## 2013-09-28 DIAGNOSIS — Y9229 Other specified public building as the place of occurrence of the external cause: Secondary | ICD-10-CM

## 2013-09-28 DIAGNOSIS — S72002A Fracture of unspecified part of neck of left femur, initial encounter for closed fracture: Secondary | ICD-10-CM

## 2013-09-28 DIAGNOSIS — E119 Type 2 diabetes mellitus without complications: Secondary | ICD-10-CM | POA: Diagnosis present

## 2013-09-28 DIAGNOSIS — Z7982 Long term (current) use of aspirin: Secondary | ICD-10-CM

## 2013-09-28 DIAGNOSIS — D62 Acute posthemorrhagic anemia: Secondary | ICD-10-CM | POA: Diagnosis not present

## 2013-09-28 HISTORY — DX: Essential (primary) hypertension: I10

## 2013-09-28 HISTORY — DX: Type 2 diabetes mellitus without complications: E11.9

## 2013-09-28 HISTORY — DX: Fibromyalgia: M79.7

## 2013-09-28 HISTORY — DX: Hyperlipidemia, unspecified: E78.5

## 2013-09-28 HISTORY — DX: Dorsalgia, unspecified: M54.9

## 2013-09-28 LAB — GLUCOSE, CAPILLARY: Glucose-Capillary: 102 mg/dL — ABNORMAL HIGH (ref 70–99)

## 2013-09-28 LAB — CBC WITH DIFFERENTIAL/PLATELET
Basophils Absolute: 0 10*3/uL (ref 0.0–0.1)
Basophils Relative: 0 % (ref 0–1)
Eosinophils Absolute: 0.2 10*3/uL (ref 0.0–0.7)
Eosinophils Relative: 2 % (ref 0–5)
HCT: 32.5 % — ABNORMAL LOW (ref 36.0–46.0)
Hemoglobin: 10.3 g/dL — ABNORMAL LOW (ref 12.0–15.0)
Lymphocytes Relative: 6 % — ABNORMAL LOW (ref 12–46)
Lymphs Abs: 0.8 K/uL (ref 0.7–4.0)
MCH: 26.5 pg (ref 26.0–34.0)
MCHC: 31.7 g/dL (ref 30.0–36.0)
MCV: 83.5 fL (ref 78.0–100.0)
Monocytes Absolute: 0.3 K/uL (ref 0.1–1.0)
Monocytes Relative: 2 % — ABNORMAL LOW (ref 3–12)
Neutro Abs: 11.6 10*3/uL — ABNORMAL HIGH (ref 1.7–7.7)
Neutrophils Relative %: 90 % — ABNORMAL HIGH (ref 43–77)
Platelets: 350 10*3/uL (ref 150–400)
RBC: 3.89 MIL/uL (ref 3.87–5.11)
RDW: 14.3 % (ref 11.5–15.5)
WBC: 12.9 K/uL — ABNORMAL HIGH (ref 4.0–10.5)

## 2013-09-28 LAB — BASIC METABOLIC PANEL WITH GFR
CO2: 26 meq/L (ref 19–32)
Chloride: 102 meq/L (ref 96–112)
Potassium: 4.5 meq/L (ref 3.5–5.1)
Sodium: 141 meq/L (ref 135–145)

## 2013-09-28 LAB — URINALYSIS, ROUTINE W REFLEX MICROSCOPIC
Bilirubin Urine: NEGATIVE
Glucose, UA: NEGATIVE mg/dL
Hgb urine dipstick: NEGATIVE
Ketones, ur: 15 mg/dL — AB
Leukocytes, UA: NEGATIVE
Nitrite: NEGATIVE
Protein, ur: NEGATIVE mg/dL
Specific Gravity, Urine: 1.02 (ref 1.005–1.030)
Urobilinogen, UA: 1 mg/dL (ref 0.0–1.0)
pH: 7 (ref 5.0–8.0)

## 2013-09-28 LAB — BASIC METABOLIC PANEL
BUN: 18 mg/dL (ref 6–23)
Calcium: 10.8 mg/dL — ABNORMAL HIGH (ref 8.4–10.5)
Creatinine, Ser: 0.88 mg/dL (ref 0.50–1.10)
GFR calc Af Amer: 79 mL/min — ABNORMAL LOW (ref >90)
GFR calc non Af Amer: 68 mL/min — ABNORMAL LOW (ref >90)
Glucose, Bld: 114 mg/dL — ABNORMAL HIGH (ref 70–99)

## 2013-09-28 LAB — PROTIME-INR
INR: 0.96 (ref 0.00–1.49)
Prothrombin Time: 12.6 seconds (ref 11.6–15.2)

## 2013-09-28 MED ORDER — SODIUM CHLORIDE 0.9 % IV SOLN
20.0000 mL | INTRAVENOUS | Status: DC
  Administered 2013-09-28: 16:00:00 via INTRAVENOUS

## 2013-09-28 MED ORDER — FENTANYL CITRATE 0.05 MG/ML IJ SOLN
50.0000 ug | Freq: Once | INTRAMUSCULAR | Status: AC
  Administered 2013-09-28: 50 ug via INTRAVENOUS

## 2013-09-28 MED ORDER — SERTRALINE HCL 100 MG PO TABS
100.0000 mg | ORAL_TABLET | Freq: Every evening | ORAL | Status: DC
  Administered 2013-09-29 – 2013-10-02 (×4): 100 mg via ORAL
  Filled 2013-09-28 (×6): qty 1

## 2013-09-28 MED ORDER — HYDROMORPHONE HCL PF 1 MG/ML IJ SOLN: 1.0000 mg | Freq: Once | INTRAMUSCULAR | Status: DC

## 2013-09-28 MED ORDER — ENOXAPARIN SODIUM 40 MG/0.4ML ~~LOC~~ SOLN
40.0000 mg | SUBCUTANEOUS | Status: DC
  Administered 2013-09-28: 40 mg via SUBCUTANEOUS
  Filled 2013-09-28 (×2): qty 0.4

## 2013-09-28 MED ORDER — ALPRAZOLAM 0.5 MG PO TABS
0.5000 mg | ORAL_TABLET | Freq: Every evening | ORAL | Status: DC | PRN
  Administered 2013-09-28 – 2013-09-29 (×2): 0.5 mg via ORAL
  Filled 2013-09-28 (×2): qty 1

## 2013-09-28 MED ORDER — HYDROMORPHONE HCL PF 1 MG/ML IJ SOLN
1.0000 mg | Freq: Once | INTRAMUSCULAR | Status: AC
  Administered 2013-09-28: 1 mg via INTRAVENOUS
  Filled 2013-09-28: qty 1

## 2013-09-28 MED ORDER — HYDROCODONE-ACETAMINOPHEN 5-325 MG PO TABS
1.0000 | ORAL_TABLET | Freq: Four times a day (QID) | ORAL | Status: DC | PRN
  Administered 2013-09-28 – 2013-10-03 (×12): 2 via ORAL
  Filled 2013-09-28 (×10): qty 2

## 2013-09-28 MED ORDER — FENTANYL CITRATE 0.05 MG/ML IJ SOLN
50.0000 ug | INTRAMUSCULAR | Status: AC | PRN
  Administered 2013-09-28 (×2): 50 ug via INTRAVENOUS
  Filled 2013-09-28 (×4): qty 2

## 2013-09-28 MED ORDER — CHLORHEXIDINE GLUCONATE 4 % EX LIQD
60.0000 mL | Freq: Once | CUTANEOUS | Status: DC
  Filled 2013-09-28: qty 60

## 2013-09-28 MED ORDER — SODIUM CHLORIDE 0.9 % IV SOLN
Freq: Once | INTRAVENOUS | Status: AC
  Administered 2013-09-29: 19:00:00 via INTRAVENOUS

## 2013-09-28 MED ORDER — LISINOPRIL 20 MG PO TABS
20.0000 mg | ORAL_TABLET | Freq: Every day | ORAL | Status: DC
  Filled 2013-09-28: qty 1

## 2013-09-28 MED ORDER — INSULIN ASPART 100 UNIT/ML ~~LOC~~ SOLN
0.0000 [IU] | Freq: Three times a day (TID) | SUBCUTANEOUS | Status: DC
  Administered 2013-10-01 – 2013-10-02 (×2): 1 [IU] via SUBCUTANEOUS

## 2013-09-28 MED ORDER — MORPHINE SULFATE 2 MG/ML IJ SOLN
0.5000 mg | INTRAMUSCULAR | Status: DC | PRN
  Administered 2013-09-29 – 2013-09-30 (×4): 0.5 mg via INTRAVENOUS
  Filled 2013-09-28 (×5): qty 1

## 2013-09-28 NOTE — ED Notes (Signed)
Pt drowsy for pain medication.

## 2013-09-28 NOTE — ED Notes (Signed)
Pt brought to ED by EMS  With fall injuring her left femur and hip.No LOC or bruises noted.Fentanyl given IV given by EMS .

## 2013-09-28 NOTE — Consult Note (Signed)
Reason for Consult:left hip pain after fall  Referring Physician: [name], [name] is an 62 y.o. female.  HPI: 62 year old female presents with left hip pain. Was at [facility] shopping this afternoon, misstepped and fell down a stair, hitting left hip directly on stair. Had immediate hip pain and dysfunction. Unable to ambulate. Worse with mvmt, better with rest. Denies other joint pain/problems. Prior to injury could ambulate without assistance. Lives alone. ASA 81 mg daily. Had lumbar spine surgery 2 years ago and recounts a week long episode of amnesia after secondary to anesthesia.   Past Medical History  Diagnosis Date  . Hypertension   . Diabetes mellitus without complication   . Hyperlipidemia   . Fibromyalgia   . Back pain     History reviewed. No pertinent past surgical history.  History reviewed. No pertinent family history.  Social History:  reports that she does not drink alcohol. Her tobacco and drug histories are not on file.  Allergies:  Allergies  Allergen Reactions  . Other Other (See Comments)    Unknown anesthetic; unknown reaction; Anesthetic was given during back surgery    Medications: I have reviewed the patient's current medications.  Results for orders placed during the hospital encounter of 09/28/13 (from the past 48 hour(s))  GLUCOSE, CAPILLARY     Status: Abnormal   Collection Time    09/28/13  3:55 PM      Result Value Range   Glucose-Capillary 102 (*) 70 - 99 mg/dL   Comment 1 Notify RN     Comment 2 Documented in Chart    TYPE AND SCREEN     Status: None   Collection Time    09/28/13  4:24 PM      Result Value Range   ABO/RH(D) O POS     Antibody Screen NEG     Sample Expiration 10/01/2013    BASIC METABOLIC PANEL     Status: Abnormal   Collection Time    09/28/13  4:32 PM      Result Value Range   Sodium 141  135 - 145 mEq/L   Potassium 4.5  3.5 - 5.1 mEq/L   Chloride 102  96 - 112 mEq/L   CO2 26  19 - 32 mEq/L   Glucose, Bld  114 (*) 70 - 99 mg/dL   BUN 18  6 - 23 mg/dL   Creatinine, Ser 1.61  0.50 - 1.10 mg/dL   Calcium 09.6 (*) 8.4 - 10.5 mg/dL   GFR calc non Af Amer 68 (*) >90 mL/min   GFR calc Af Amer 79 (*) >90 mL/min   Comment: (NOTE)     The eGFR has been calculated using the CKD EPI equation.     This calculation has not been validated in all clinical situations.     eGFR's persistently <90 mL/min signify possible Chronic Kidney     Disease.  CBC WITH DIFFERENTIAL     Status: Abnormal   Collection Time    09/28/13  4:32 PM      Result Value Range   WBC 12.9 (*) 4.0 - 10.5 K/uL   RBC 3.89  3.87 - 5.11 MIL/uL   Hemoglobin 10.3 (*) 12.0 - 15.0 g/dL   HCT 04.5 (*) 40.9 - 81.1 %   MCV 83.5  78.0 - 100.0 fL   MCH 26.5  26.0 - 34.0 pg   MCHC 31.7  30.0 - 36.0 g/dL   RDW 91.4  78.2 -  15.5 %   Platelets 350  150 - 400 K/uL   Neutrophils Relative % 90 (*) 43 - 77 %   Neutro Abs 11.6 (*) 1.7 - 7.7 K/uL   Lymphocytes Relative 6 (*) 12 - 46 %   Lymphs Abs 0.8  0.7 - 4.0 K/uL   Monocytes Relative 2 (*) 3 - 12 %   Monocytes Absolute 0.3  0.1 - 1.0 K/uL   Eosinophils Relative 2  0 - 5 %   Eosinophils Absolute 0.2  0.0 - 0.7 K/uL   Basophils Relative 0  0 - 1 %   Basophils Absolute 0.0  0.0 - 0.1 K/uL  PROTIME-INR     Status: None   Collection Time    09/28/13  4:32 PM      Result Value Range   Prothrombin Time 12.6  11.6 - 15.2 seconds   INR 0.96  0.00 - 1.49  URINALYSIS, ROUTINE W REFLEX MICROSCOPIC     Status: Abnormal   Collection Time    09/28/13  5:57 PM      Result Value Range   Color, Urine YELLOW  YELLOW   APPearance CLEAR  CLEAR   Specific Gravity, Urine 1.020  1.005 - 1.030   pH 7.0  5.0 - 8.0   Glucose, UA NEGATIVE  NEGATIVE mg/dL   Hgb urine dipstick NEGATIVE  NEGATIVE   Bilirubin Urine NEGATIVE  NEGATIVE   Ketones, ur 15 (*) NEGATIVE mg/dL   Protein, ur NEGATIVE  NEGATIVE mg/dL   Urobilinogen, UA 1.0  0.0 - 1.0 mg/dL   Nitrite NEGATIVE  NEGATIVE   Leukocytes, UA NEGATIVE   NEGATIVE   Comment: MICROSCOPIC NOT DONE ON URINES WITH NEGATIVE PROTEIN, BLOOD, LEUKOCYTES, NITRITE, OR GLUCOSE <1000 mg/dL.    Dg Chest 1 View  09/28/2013   CLINICAL DATA:  Fall, cough, congestion  EXAM: CHEST - 1 VIEW  COMPARISON:  09/09/2011  FINDINGS: Normal heart size for technique. No focal pneumonia, collapse or consolidation. No effusion or pneumothorax. Trachea midline. Degenerative changes of the spine.  IMPRESSION: No acute chest findings.   Electronically Signed   By: Ruel Favors M.D.   On: 09/28/2013 17:08   Dg Hip Complete Left  09/28/2013   CLINICAL DATA:  Left hip pain post fall.  EXAM: LEFT HIP - COMPLETE 2+ VIEW  COMPARISON:  11/09/2009  FINDINGS: Comminuted left intertrochanteric and proximal diaphyseal femur fracture, with impaction and override of major fracture fragments. No dislocation or intra-articular involvement of the fracture. Changes of PLIF L4-5.  IMPRESSION: 1. Comminuted left intertrochanteric and proximal diaphyseal femur fracture   Electronically Signed   By: Oley Balm M.D.   On: 09/28/2013 17:13    Review of Systems  Musculoskeletal:       Left hip pain, weakness  All other systems reviewed and are negative.   Blood pressure 129/66, pulse 77, temperature 98.3 F (36.8 C), temperature source Oral, resp. rate 14, SpO2 96.00%. Physical Exam  Constitutional: She is oriented to person, place, and time. She appears well-developed and well-nourished.  HENT:  Head: Normocephalic and atraumatic.  Eyes: EOM are normal.  Cardiovascular: Normal rate and intact distal pulses.   Respiratory: Effort normal.  Musculoskeletal:  L LE shorted and externally rotated. Left hip with decreased ROM and pain limiting exam. Left hip TTP. Wiggles toes, distally NVI.  Knee/ankle with full rom, nontender  Neurological: She is alert and oriented to person, place, and time.  Skin: Skin is warm and dry.  Psychiatric:  She has a normal mood and affect. Her behavior is  normal. Judgment and thought content normal.    Assessment/Plan: Left comminuted left intertochanteric and proximal diaphyseal femur fracture Discussed with patient dx and tx options, surgical procedure and post operative course. To maximize function, achieve quicker ambulatory status, surgical fixation it determined necessary Will plan for left IM trochanteric nail tomorrow after 11am NPO after midnight, NWB L LE hospitalist will admit, we appreciate their help   Jiles Harold 09/28/2013, 7:38 PM

## 2013-09-28 NOTE — Consult Note (Signed)
Agree with above. Comminuted proximal L femur fracture.  Plan for IMN tomorrow.  Risks /benefits discussed at length.  NPO past midnight. 

## 2013-09-28 NOTE — ED Provider Notes (Addendum)
CSN: [id number]     Arrival date & time 09/28/13  1540 History   First MD Initiated Contact with Patient 09/28/13 1542     Chief Complaint  Patient presents with  . Hip Injury   (Consider location/radiation/quality/duration/timing/severity/associated sxs/prior Treatment) HPI Comments: Pt tripped while in the store, fell against a stair to left hip area.  No head injury, no LOC, denies neck or back pain.  Per EMS, felt deformity posteriorly.  Spine board for transport, given 150 mcg of fentnayl en route for comfort.  Pt reports no pain when not moving, but severe when moved.  No distal numbness or weakness.  Takes baby aspirin, no other blood thinners.  No CP, SOB.    Patient is a 62 y.o. female presenting with hip pain and fall.  Hip Pain This is a new problem. The current episode started less than 1 hour ago. The problem occurs constantly. The problem has not changed since onset.Pertinent negatives include no chest pain, no abdominal pain, no headaches and no shortness of breath. The symptoms are aggravated by bending and twisting. The symptoms are relieved by rest.  Fall This is a new problem. The current episode started less than 1 hour ago. Pertinent negatives include no chest pain, no abdominal pain, no headaches and no shortness of breath.    History reviewed. No pertinent past medical history. History reviewed. No pertinent past surgical history. No family history on file. History  Substance Use Topics  . Smoking status: Not on file  . Smokeless tobacco: Not on file  . Alcohol Use: Not on file   OB History   Grav Para Term Preterm Abortions TAB SAB Ect Mult Living                 Review of Systems  Respiratory: Negative for chest tightness and shortness of breath.   Cardiovascular: Negative for chest pain.  Gastrointestinal: Negative for abdominal pain.  Musculoskeletal: Positive for arthralgias. Negative for back pain and neck pain.  Skin: Negative for wound.   Neurological: Negative for syncope and headaches.  All other systems reviewed and are negative.    Allergies  Other  Home Medications   Current Outpatient Rx  Name  Route  Sig  Dispense  Refill  . ALPRAZolam (XANAX) 0.5 MG tablet   Oral   Take 0.5 mg by mouth at bedtime as needed.         Marland Kitchen aspirin 81 MG chewable tablet   Oral   Chew 81 mg by mouth every evening.         . cholecalciferol (VITAMIN D) 400 UNITS TABS tablet   Oral   Take 400 Units by mouth daily.         . CRESTOR 20 MG tablet   Oral   Take 20 mg by mouth every evening.         . metFORMIN (GLUCOPHAGE-XR) 500 MG 24 hr tablet   Oral   Take 1,000 mg by mouth 2 (two) times daily.         . quinapril-hydrochlorothiazide (ACCURETIC) 20-12.5 MG per tablet   Oral   Take 1 tablet by mouth every evening.         . sertraline (ZOLOFT) 100 MG tablet   Oral   Take 100 mg by mouth every evening.          BP 135/66  Pulse 58  Temp(Src) 98.3 F (36.8 C) (Oral)  Resp 18  SpO2 99% Physical Exam  Nursing note and vitals reviewed. Constitutional: She is oriented to person, place, and time. She appears well-developed and well-nourished. No distress.  HENT:  Head: Normocephalic and atraumatic.  Neck: Normal range of motion. Neck supple.  Cardiovascular: Normal rate, regular rhythm and intact distal pulses.   No murmur heard. Pulmonary/Chest: Effort normal. No respiratory distress. She has no wheezes. She has no rales.  Abdominal: Soft. She exhibits no distension. There is no tenderness.  Musculoskeletal:       Left hip: She exhibits decreased range of motion, tenderness, bony tenderness, crepitus and deformity.       Cervical back: She exhibits no tenderness and no bony tenderness.       Lumbar back: She exhibits no tenderness and no bony tenderness.  Neurological: She is alert and oriented to person, place, and time. Coordination normal.  Skin: Skin is warm. She is not diaphoretic.    ED  Course  Procedures (including critical care time) Labs Review Labs Reviewed  BASIC METABOLIC PANEL - Abnormal; Notable for the following:    Glucose, Bld 114 (*)    Calcium 10.8 (*)    GFR calc non Af Amer 68 (*)    GFR calc Af Amer 79 (*)    All other components within normal limits  CBC WITH DIFFERENTIAL - Abnormal; Notable for the following:    WBC 12.9 (*)    Hemoglobin 10.3 (*)    HCT 32.5 (*)    Neutrophils Relative % 90 (*)    Neutro Abs 11.6 (*)    Lymphocytes Relative 6 (*)    Monocytes Relative 2 (*)    All other components within normal limits  GLUCOSE, CAPILLARY - Abnormal; Notable for the following:    Glucose-Capillary 102 (*)    All other components within normal limits  PROTIME-INR  URINALYSIS, ROUTINE W REFLEX MICROSCOPIC  TYPE AND SCREEN   Imaging Review Dg Chest 1 View  09/28/2013   CLINICAL DATA:  Fall, cough, congestion  EXAM: CHEST - 1 VIEW  COMPARISON:  09/09/2011  FINDINGS: Normal heart size for technique. No focal pneumonia, collapse or consolidation. No effusion or pneumothorax. Trachea midline. Degenerative changes of the spine.  IMPRESSION: No acute chest findings.   Electronically Signed   By: Ruel Favors M.D.   On: 09/28/2013 17:08   Dg Hip Complete Left  09/28/2013   CLINICAL DATA:  Left hip pain post fall.  EXAM: LEFT HIP - COMPLETE 2+ VIEW  COMPARISON:  11/09/2009  FINDINGS: Comminuted left intertrochanteric and proximal diaphyseal femur fracture, with impaction and override of major fracture fragments. No dislocation or intra-articular involvement of the fracture. Changes of PLIF L4-5.  IMPRESSION: 1. Comminuted left intertrochanteric and proximal diaphyseal femur fracture   Electronically Signed   By: Oley Balm M.D.   On: 09/28/2013 17:13    EKG Interpretation   None     ra sat is 99% and I interpret to be normal  5:26 PM Pain is reasonably controlled.  Pt and family updated.    5:59 PM Spoke to hospitalist who agrees to  admit.  Also discussed with Dr. Ave Filter who will see pt later tonight, likely will plan for OR tomorrow and is ok to eat tonight  MDM   1. Intertrochanteric fracture of left hip, closed, initial encounter      Pt with mechanical fall in store, landed on hip and unable to move or stand due to severe pain.  Plain films shows intertroch fracture.  Will discuss  with [facility] for eval of hip fracture, likely surgery, and will discuss with medicine for admission as well as clearance.      Gavin Pound. Oletta Lamas, MD 09/28/13 1726  Gavin Pound. Kolleen Ochsner, MD 09/28/13 1800

## 2013-09-28 NOTE — H&P (Signed)
[facility] History and Physical  [name] UJW:[id number] DOB: 08-16-1951 DOA: 09/28/2013  Referring physician: Dr Oletta Lamas PCP: Nadean Corwin, MD  Specialists: [facility] orthoped[facility].   Chief Complaint: mechanical fall this afternoon.  HPI: [name] is a 62 y.o. female with h/o hypertension, DM, had a mechanical fall this afternoon. She was brought to ED. She complained of left hip pain, imaging showed comminuted left hip fracture. Orthopedics were consulted by EDP. She will be admitted to medical service for further evaluation and management.   Review of Systems: The patient denies anorexia, fever, weight loss,, vision loss, decreased hearing, hoarseness, chest pain, syncope, dyspnea on exertion, peripheral edema, balance deficits, hemoptysis, abdominal pain, melena, hematochezia, severe indigestion/heartburn, hematuria, incontinence, genital sores, muscle weakness, suspicious skin lesions, transient blindness,  depression, unusual weight change, abnormal bleeding, enlarged lymph nodes, angioedema, and breast masses.    Past Medical History  Diagnosis Date  . Hypertension   . Diabetes mellitus without complication   . Hyperlipidemia   . Fibromyalgia   . Back pain    History reviewed. No pertinent past surgical history. Social History:  reports that she does not drink alcohol. Her tobacco and drug histories are not on file.  where does patient live--home, Can patient participate in ADLs? yes  Allergies  Allergen Reactions  . Other Other (See Comments)    Unknown anesthetic; unknown reaction; Anesthetic was given during back surgery    History reviewed. No pertinent family history.   Prior to Admission medications   Medication Sig Start Date End Date Taking? Authorizing Provider  ALPRAZolam Prudy Feeler) 0.5 MG tablet Take 0.5 mg by mouth at bedtime as needed. 08/15/13  Yes Historical Provider, MD  aspirin 81 MG chewable tablet Chew 81 mg by mouth every  evening.   Yes Historical Provider, MD  cholecalciferol (VITAMIN D) 400 UNITS TABS tablet Take 400 Units by mouth daily.   Yes Historical Provider, MD  CRESTOR 20 MG tablet Take 20 mg by mouth every evening. 09/23/13  Yes Historical Provider, MD  metFORMIN (GLUCOPHAGE-XR) 500 MG 24 hr tablet Take 1,000 mg by mouth 2 (two) times daily. 09/23/13  Yes Historical Provider, MD  quinapril-hydrochlorothiazide (ACCURETIC) 20-12.5 MG per tablet Take 1 tablet by mouth every evening. 07/14/13  Yes Historical Provider, MD  sertraline (ZOLOFT) 100 MG tablet Take 100 mg by mouth every evening. 07/14/13  Yes Historical Provider, MD   Physical Exam: Filed Vitals:   09/28/13 1830  BP: 154/70  Pulse: 80  Temp:   Resp: 16    Constitutional: Vital signs reviewed.  Patient is a well-developed and well-nourished  in no acute distress and cooperative with exam. Alert and oriented x3.  Head: Normocephalic and atraumatic Mouth: no erythema or exudates, MMM Eyes: PERRL, EOMI, conjunctivae normal, No scleral icterus.  Neck: Supple, Trachea midline normal ROM, No JVD, mass, thyromegaly, or carotid bruit present.  Cardiovascular: RRR, S1 normal, S2 normal, no MRG, pulses symmetric and intact bilaterally Pulmonary/Chest: normal respiratory effort, CTAB, no wheezes, rales, or rhonchi Abdominal: Soft. Non-tender, non-distended, bowel sounds are normal, no masses, organomegaly, or guarding present.  Musculoskeletal: left leg externally rotated and shortened.  Neurological: A&O x3, strength int he left lower extremity could not be assessed. No focal deficits. Speech normal .  Skin: Warm, dry and intact. No rash, cyanosis, or clubbing.  Psychiatric: Normal mood and affect.     Labs on Admission:  Basic Metabolic Panel:  Recent Labs Lab 09/28/13 1632  NA 141  K 4.5  CL 102  CO2 26  GLUCOSE 114*  BUN 18  CREATININE 0.88  CALCIUM 10.8*   Liver Function Tests: No results found for this basename: AST, ALT,  ALKPHOS, BILITOT, PROT, ALBUMIN,  in the last 168 hours No results found for this basename: LIPASE, AMYLASE,  in the last 168 hours No results found for this basename: AMMONIA,  in the last 168 hours CBC:  Recent Labs Lab 09/28/13 1632  WBC 12.9*  NEUTROABS 11.6*  HGB 10.3*  HCT 32.5*  MCV 83.5  PLT 350   Cardiac Enzymes: No results found for this basename: CKTOTAL, CKMB, CKMBINDEX, TROPONINI,  in the last 168 hours  BNP (last 3 results) No results found for this basename: PROBNP,  in the last 8760 hours CBG:  Recent Labs Lab 09/28/13 1555  GLUCAP 102*    Radiological Exams on Admission: Dg Chest 1 View  09/28/2013   CLINICAL DATA:  Fall, cough, congestion  EXAM: CHEST - 1 VIEW  COMPARISON:  09/09/2011  FINDINGS: Normal heart size for technique. No focal pneumonia, collapse or consolidation. No effusion or pneumothorax. Trachea midline. Degenerative changes of the spine.  IMPRESSION: No acute chest findings.   Electronically Signed   By: Ruel Favors M.D.   On: 09/28/2013 17:08   Dg Hip Complete Left  09/28/2013   CLINICAL DATA:  Left hip pain post fall.  EXAM: LEFT HIP - COMPLETE 2+ VIEW  COMPARISON:  11/09/2009  FINDINGS: Comminuted left intertrochanteric and proximal diaphyseal femur fracture, with impaction and override of major fracture fragments. No dislocation or intra-articular involvement of the fracture. Changes of PLIF L4-5.  IMPRESSION: 1. Comminuted left intertrochanteric and proximal diaphyseal femur fracture   Electronically Signed   By: Oley Balm M.D.   On: 09/28/2013 17:13    EKG: pending.   Assessment/Plan Active Problems: 1. Left hip fracture:  - admit to inpatient. - orthopedics consulted and she will probably require surgery in am.  - PAIN control and IV fluids.   2. Hypertension: suboptimal.  - resume home medications.   3. Diabetes Mellitus:  - gba1c  - SSI 4. DVT prophylaxis.   Code Status: full code Family Communication:none  atbedside Disposition Plan: pending PT eval.  Time spent: 65 min  Phinehas Grounds [facility] Pager [phone]  If 7PM-7AM, please contact night-coverage [email or web address] Password [email or web address] 09/28/2013, 7:11 PM

## 2013-09-29 ENCOUNTER — Encounter (HOSPITAL_COMMUNITY)
Admission: EM | Disposition: A | Discharge status: Skilled Nursing Facility | Payer: Self-pay | Source: Home / Self Care | Attending: Internal Medicine

## 2013-09-29 ENCOUNTER — Inpatient Hospital Stay (HOSPITAL_COMMUNITY): Payer: Medicare Other | Admitting: Certified Registered Nurse Anesthetist

## 2013-09-29 ENCOUNTER — Encounter (HOSPITAL_COMMUNITY): Payer: Self-pay | Admitting: Orthopedic Surgery

## 2013-09-29 ENCOUNTER — Encounter (HOSPITAL_COMMUNITY): Payer: Medicare Other | Admitting: Certified Registered Nurse Anesthetist

## 2013-09-29 ENCOUNTER — Inpatient Hospital Stay (HOSPITAL_COMMUNITY): Payer: Medicare Other

## 2013-09-29 DIAGNOSIS — I10 Essential (primary) hypertension: Secondary | ICD-10-CM

## 2013-09-29 DIAGNOSIS — E119 Type 2 diabetes mellitus without complications: Secondary | ICD-10-CM

## 2013-09-29 HISTORY — PX: INTRAMEDULLARY (IM) NAIL INTERTROCHANTERIC: SHX5875

## 2013-09-29 LAB — CBC
HCT: 29.2 % — ABNORMAL LOW (ref 36.0–46.0)
Hemoglobin: 9.3 g/dL — ABNORMAL LOW (ref 12.0–15.0)
MCH: 26.5 pg (ref 26.0–34.0)
MCV: 83.2 fL (ref 78.0–100.0)
RBC: 3.51 MIL/uL — ABNORMAL LOW (ref 3.87–5.11)
RDW: 14.2 % (ref 11.5–15.5)
WBC: 16.4 10*3/uL — ABNORMAL HIGH (ref 4.0–10.5)

## 2013-09-29 LAB — SURGICAL PCR SCREEN: Staphylococcus aureus: NEGATIVE

## 2013-09-29 LAB — BASIC METABOLIC PANEL
BUN: 20 mg/dL (ref 6–23)
Chloride: 106 mEq/L (ref 96–112)
GFR calc Af Amer: >90 mL/min (ref >90)
GFR calc non Af Amer: 87 mL/min — ABNORMAL LOW (ref >90)
Potassium: 5 mEq/L (ref 3.5–5.1)

## 2013-09-29 LAB — GLUCOSE, CAPILLARY
Glucose-Capillary: 110 mg/dL — ABNORMAL HIGH (ref 70–99)
Glucose-Capillary: 110 mg/dL — ABNORMAL HIGH (ref 70–99)
Glucose-Capillary: 99 mg/dL (ref 70–99)

## 2013-09-29 LAB — HEMOGLOBIN A1C
Hgb A1c MFr Bld: 6.8 % — ABNORMAL HIGH (ref <5.7)
Mean Plasma Glucose: 148 mg/dL — ABNORMAL HIGH (ref <117)

## 2013-09-29 SURGERY — FIXATION, FRACTURE, INTERTROCHANTERIC, WITH INTRAMEDULLARY ROD
Anesthesia: General | Site: Leg Upper | Laterality: Left | Wound class: Clean

## 2013-09-29 MED ORDER — ACETAMINOPHEN 650 MG RE SUPP: 650.0000 mg | Freq: Four times a day (QID) | RECTAL | Status: DC | PRN

## 2013-09-29 MED ORDER — OXYCODONE HCL 5 MG/5ML PO SOLN: 5.0000 mg | Freq: Once | ORAL | Status: DC | PRN

## 2013-09-29 MED ORDER — ROCURONIUM BROMIDE 100 MG/10ML IV SOLN
INTRAVENOUS | Status: DC | PRN
  Administered 2013-09-29: 40 mg via INTRAVENOUS

## 2013-09-29 MED ORDER — OXYCODONE HCL 5 MG PO TABS
5.0000 mg | ORAL_TABLET | ORAL | Status: DC | PRN
  Administered 2013-09-29 – 2013-10-03 (×11): 10 mg via ORAL
  Filled 2013-09-29 (×11): qty 2

## 2013-09-29 MED ORDER — ACETAMINOPHEN 325 MG PO TABS
650.0000 mg | ORAL_TABLET | Freq: Four times a day (QID) | ORAL | Status: DC | PRN
  Administered 2013-09-30 (×2): 650 mg via ORAL
  Filled 2013-09-29 (×2): qty 2

## 2013-09-29 MED ORDER — ONDANSETRON HCL 4 MG/2ML IJ SOLN: 4.0000 mg | Freq: Four times a day (QID) | INTRAMUSCULAR | Status: DC | PRN

## 2013-09-29 MED ORDER — PHENOL 1.4 % MT LIQD
1.0000 | OROMUCOSAL | Status: DC | PRN
  Filled 2013-09-29: qty 177

## 2013-09-29 MED ORDER — WHITE PETROLATUM GEL
Status: AC
  Administered 2013-09-29: 08:00:00
  Filled 2013-09-29: qty 5

## 2013-09-29 MED ORDER — CEFAZOLIN SODIUM-DEXTROSE 2-3 GM-% IV SOLR
2.0000 g | Freq: Four times a day (QID) | INTRAVENOUS | Status: AC
  Administered 2013-09-29 (×2): 2 g via INTRAVENOUS
  Filled 2013-09-29 (×2): qty 50

## 2013-09-29 MED ORDER — MIDAZOLAM HCL 2 MG/2ML IJ SOLN: 1.0000 mg | INTRAMUSCULAR | Status: DC | PRN

## 2013-09-29 MED ORDER — METOCLOPRAMIDE HCL 5 MG/ML IJ SOLN: 5.0000 mg | Freq: Three times a day (TID) | INTRAMUSCULAR | Status: DC | PRN

## 2013-09-29 MED ORDER — MENTHOL 3 MG MT LOZG
1.0000 | LOZENGE | OROMUCOSAL | Status: DC | PRN
  Administered 2013-09-29: 3 mg via ORAL

## 2013-09-29 MED ORDER — LACTATED RINGERS IV SOLN
INTRAVENOUS | Status: DC | PRN
  Administered 2013-09-29 (×2): via INTRAVENOUS

## 2013-09-29 MED ORDER — HYDROMORPHONE HCL PF 1 MG/ML IJ SOLN
0.2500 mg | INTRAMUSCULAR | Status: DC | PRN
  Administered 2013-09-29 (×4): 0.5 mg via INTRAVENOUS

## 2013-09-29 MED ORDER — FENTANYL CITRATE 0.05 MG/ML IJ SOLN: 50.0000 ug | Freq: Once | INTRAMUSCULAR | Status: DC

## 2013-09-29 MED ORDER — NEOSTIGMINE METHYLSULFATE 1 MG/ML IJ SOLN
INTRAMUSCULAR | Status: DC | PRN
  Administered 2013-09-29: 4 mg via INTRAVENOUS

## 2013-09-29 MED ORDER — CEFAZOLIN SODIUM-DEXTROSE 2-3 GM-% IV SOLR
INTRAVENOUS | Status: AC
  Administered 2013-09-29: 2 g via INTRAVENOUS
  Filled 2013-09-29: qty 50

## 2013-09-29 MED ORDER — FENTANYL CITRATE 0.05 MG/ML IJ SOLN
INTRAMUSCULAR | Status: DC | PRN
  Administered 2013-09-29: 100 ug via INTRAVENOUS
  Administered 2013-09-29 (×2): 50 ug via INTRAVENOUS

## 2013-09-29 MED ORDER — MORPHINE SULFATE 2 MG/ML IJ SOLN
0.5000 mg | INTRAMUSCULAR | Status: DC | PRN
  Administered 2013-09-29: 0.5 mg via INTRAVENOUS

## 2013-09-29 MED ORDER — 0.9 % SODIUM CHLORIDE (POUR BTL) OPTIME
TOPICAL | Status: DC | PRN
  Administered 2013-09-29: 1000 mL

## 2013-09-29 MED ORDER — PHENYLEPHRINE HCL 10 MG/ML IJ SOLN
INTRAMUSCULAR | Status: DC | PRN
  Administered 2013-09-29: 120 ug via INTRAVENOUS

## 2013-09-29 MED ORDER — ARTIFICIAL TEARS OP OINT
TOPICAL_OINTMENT | OPHTHALMIC | Status: DC | PRN
  Administered 2013-09-29: 1 via OPHTHALMIC

## 2013-09-29 MED ORDER — HYDROMORPHONE HCL PF 1 MG/ML IJ SOLN
INTRAMUSCULAR | Status: AC
  Filled 2013-09-29: qty 1

## 2013-09-29 MED ORDER — METOCLOPRAMIDE HCL 5 MG PO TABS
5.0000 mg | ORAL_TABLET | Freq: Three times a day (TID) | ORAL | Status: DC | PRN
  Filled 2013-09-29: qty 2

## 2013-09-29 MED ORDER — PROPOFOL 10 MG/ML IV BOLUS
INTRAVENOUS | Status: DC | PRN
  Administered 2013-09-29: 200 mg via INTRAVENOUS

## 2013-09-29 MED ORDER — PROMETHAZINE HCL 25 MG/ML IJ SOLN: 6.2500 mg | INTRAMUSCULAR | Status: DC | PRN

## 2013-09-29 MED ORDER — SODIUM CHLORIDE 0.9 % IV SOLN
10.0000 mg | INTRAVENOUS | Status: DC | PRN
  Administered 2013-09-29: 50 ug/min via INTRAVENOUS

## 2013-09-29 MED ORDER — LIDOCAINE HCL (CARDIAC) 20 MG/ML IV SOLN
INTRAVENOUS | Status: DC | PRN
  Administered 2013-09-29: 50 mg via INTRAVENOUS

## 2013-09-29 MED ORDER — HYDROCODONE-ACETAMINOPHEN 5-325 MG PO TABS
1.0000 | ORAL_TABLET | Freq: Four times a day (QID) | ORAL | Status: DC | PRN
  Filled 2013-09-29 (×2): qty 2

## 2013-09-29 MED ORDER — ONDANSETRON HCL 4 MG/2ML IJ SOLN
INTRAMUSCULAR | Status: DC | PRN
  Administered 2013-09-29: 4 mg via INTRAVENOUS

## 2013-09-29 MED ORDER — OXYCODONE HCL 5 MG PO TABS: 5.0000 mg | ORAL_TABLET | Freq: Once | ORAL | Status: DC | PRN

## 2013-09-29 MED ORDER — EPHEDRINE SULFATE 50 MG/ML IJ SOLN
INTRAMUSCULAR | Status: DC | PRN
  Administered 2013-09-29 (×3): 10 mg via INTRAVENOUS

## 2013-09-29 MED ORDER — ONDANSETRON HCL 4 MG PO TABS: 4.0000 mg | ORAL_TABLET | Freq: Four times a day (QID) | ORAL | Status: DC | PRN

## 2013-09-29 MED ORDER — GLYCOPYRROLATE 0.2 MG/ML IJ SOLN
INTRAMUSCULAR | Status: DC | PRN
  Administered 2013-09-29: .8 mg via INTRAVENOUS
  Administered 2013-09-29: 0.2 mg via INTRAVENOUS

## 2013-09-29 MED ORDER — ASPIRIN EC 325 MG PO TBEC
325.0000 mg | DELAYED_RELEASE_TABLET | Freq: Two times a day (BID) | ORAL | Status: DC
  Administered 2013-09-29 – 2013-10-03 (×8): 325 mg via ORAL
  Filled 2013-09-29 (×11): qty 1

## 2013-09-29 MED ORDER — HYDROMORPHONE HCL PF 1 MG/ML IJ SOLN: 0.1000 mg | INTRAMUSCULAR | Status: DC | PRN

## 2013-09-29 SURGICAL SUPPLY — 38 items
BANDAGE GAUZE ELAST BULKY 4 IN (GAUZE/BANDAGES/DRESSINGS) ×2 IMPLANT
BIT DRILL 4.3MMS DISTAL GRDTED (BIT) IMPLANT
BNDG COHESIVE 4X5 TAN STRL (GAUZE/BANDAGES/DRESSINGS) ×2 IMPLANT
CHLORAPREP W/TINT 26ML (MISCELLANEOUS) ×2 IMPLANT
CLOTH BEACON ORANGE TIMEOUT ST (SAFETY) ×1 IMPLANT
COVER SURGICAL LIGHT HANDLE (MISCELLANEOUS) ×2 IMPLANT
DRAPE INCISE IOBAN 66X45 STRL (DRAPES) ×2 IMPLANT
DRAPE STERI IOBAN 125X83 (DRAPES) ×2 IMPLANT
DRAPE SURG 17X23 STRL (DRAPES) ×8 IMPLANT
DRILL 4.3MMS DISTAL GRADUATED (BIT) ×2
DRSG MEPILEX BORDER 4X4 (GAUZE/BANDAGES/DRESSINGS) ×4 IMPLANT
DRSG PAD ABDOMINAL 8X10 ST (GAUZE/BANDAGES/DRESSINGS) ×3 IMPLANT
ELECT REM PT RETURN 9FT ADLT (ELECTROSURGICAL) ×2
ELECTRODE REM PT RTRN 9FT ADLT (ELECTROSURGICAL) ×1 IMPLANT
GLOVE BIO SURGEON STRL SZ7 (GLOVE) ×2 IMPLANT
GLOVE BIO SURGEON STRL SZ7.5 (GLOVE) ×2 IMPLANT
GLOVE BIOGEL PI IND STRL 8 (GLOVE) ×1 IMPLANT
GLOVE BIOGEL PI INDICATOR 8 (GLOVE) ×1
GOWN PREVENTION PLUS LG XLONG (DISPOSABLE) ×2 IMPLANT
GOWN STRL NON-REIN LRG LVL3 (GOWN DISPOSABLE) ×2 IMPLANT
GUIDEWIRE BALL NOSE 100CM (WIRE) ×1 IMPLANT
HIP FRAC NAIL LAG SCR 10.5X100 (Orthopedic Implant) ×1 IMPLANT
KIT ROOM TURNOVER OR (KITS) ×2 IMPLANT
MANIFOLD NEPTUNE II (INSTRUMENTS) ×2 IMPLANT
NS IRRIG 1000ML POUR BTL (IV SOLUTION) ×2 IMPLANT
PACK GENERAL/GYN (CUSTOM PROCEDURE TRAY) ×2 IMPLANT
PAD ARMBOARD 7.5X6 YLW CONV (MISCELLANEOUS) ×4 IMPLANT
SCREW BONE CORTICAL 5.0X38 (Screw) ×1 IMPLANT
SCREW CANN THRD AFF 10.5X100 (Orthopedic Implant) IMPLANT
SCREW LAG HIP NAIL 11X320 (Screw) ×1 IMPLANT
STAPLER VISISTAT 35W (STAPLE) ×2 IMPLANT
SUT VIC AB 1 CT1 27 (SUTURE) ×2
SUT VIC AB 1 CT1 27XBRD ANBCTR (SUTURE) ×1 IMPLANT
SUT VIC AB 2-0 CT1 27 (SUTURE) ×2
SUT VIC AB 2-0 CT1 TAPERPNT 27 (SUTURE) ×1 IMPLANT
TOWEL OR 17X24 6PK STRL BLUE (TOWEL DISPOSABLE) ×2 IMPLANT
TOWEL OR 17X26 10 PK STRL BLUE (TOWEL DISPOSABLE) ×2 IMPLANT
WATER STERILE IRR 1000ML POUR (IV SOLUTION) ×2 IMPLANT

## 2013-09-29 NOTE — Op Note (Signed)
Procedure(s): INTRAMEDULLARY (IM) NAIL INTERTROCHANTRIC Procedure Note  [name] female 62 y.o. 09/29/2013  Procedure(s) and Anesthesia Type:    *LEFT INTRAMEDULLARY (IM) NAIL INTERTROCHANTRIC - General  Surgeon(s) and Role:    * Mable Paris, MD - Primary   Indications:  63 y.o. female s/p fall with left comminuted 4 part intratrochanteric fracture. Indicated for surgery to promote early ambulation, pain control and prevent complications of bed rest.     Surgeon: Mable Paris   Assistants: Damita Lack PA-C (Danielle was present and scrubbed throughout the procedure and was essential in positioning, retraction, exposure, and closure)  Anesthesia: General endotracheal anesthesia    Procedure Detail  INTRAMEDULLARY (IM) NAIL INTERTROCHANTRIC  Findings: Biomet affixus trochanteric nail size 32 x 11, one distal interlocking screw. Significant fracture comminution. There was some splaying of the proximal segments the overall alignment was restored.  Estimated Blood Loss:  200 mL         Drains: none  Blood Given: none          Specimens: none        Complications:  * No complications entered in OR log *         Disposition: PACU - hemodynamically stable.         Condition: stable    Procedure:  The patient was identified in the preoperative holding area  where I personally marked the operative site after verifying site, side,  and procedure with the patient. She was taken back to the operating  room where general anesthesia was induced without complication. She was  placed on the fracture table with the left lower extremity in traction,  and opposite lower extremity in a flexed abducted position. The arms were well  padded. Fluoroscopic imaging was used to verify reduction with gentle traction  and internal rotation. The proximal lateral segment tended to splay out slightly but overall alignment was restored well. The left hip was  then prepped and draped in the standard sterile fashion. An approximately 3 cm incision was made proximal  to the palpable greater trochanter tip. Dissection was carried down to  the tip and the short guidewire was placed under fluoroscopic imaging.  The proximal entry reamer was used to open the canal and the ball-tipped  guidewire was then placed and advanced down the femoral canal. This was  used to obtain a measurement for the implant and then was subsequently  over reamed up to 12.5 mm by 0.5 mm increments. The 320 mm nail was then  advanced over the guidewire without difficulty and the proximal jig was  then placed and a small 1.5 cm incision was made on the lateral thigh to  advance the lag screw guide against the lateral aspect of the femur.  The guide pin was advanced and its position was verified in AP and  lateral planes to be centered in the head.  The guidewire was over  reamed and the appropriate size lag screw was advanced. The  proximal set screw was then advanced fully, as not to allow sliding in this unstable fracture.  AP and lateral imaging demonstrated appropriate position of  the screw and reduction of the fracture. The proximal jig was then  removed. Attention was turned to the knee. Where using perfect  circles technique on the lateral x-ray, one distal interlocking screw  was placed from lateral to medial through a percutaneous incision.  Final fluoroscopic imaging in AP and lateral planes at the hip and the  knee demonstrated near anatomic reduction with appropriate length and  position of the hardware. All wounds were then copiously irrigated with  normal saline and subsequently closed in layers with #1 Vicryl in a deep  fascia layer, 2-0 Vicryl in a deep dermal layer, and staples for skin  closure. Sterile dressings were then applied including 4x4s and Mepilex  dressings. The patient was then taken off the fracture table,  transferred to the stretcher, and taken  to the recovery room in stable  condition after she was extubated.   POSTOPERATIVE PLAN: She will be weightbearing as tolerated on the operative extremity. She will have DVT prophylaxis of aspirin twice a day and SCDs while in the hospital.

## 2013-09-29 NOTE — H&P (View-Only) (Signed)
Reason for Consult:left hip pain after fall  Referring Physician: Akula, [name]  [name] is an 62 y.o. female.  HPI: 62 year old female presents presents with left hip pain. Was at [employer] shopping this afternoon, misstepped and fell down a stair, hitting left hip directly on stair. Had immediate hip pain and dysfunction. Unable to ambulate. Worse with mvmt, better with rest. Denies other joint pain/problems. Prior to injury could ambulate without assistance. Lives alone. ASA 81 mg daily. Had lumbar spine surgery 2 years ago and recounts a week long episode of amnesia after secondary to anesthesia.   Past Medical History  Diagnosis Date  . Hypertension   . Diabetes mellitus without complication   . Hyperlipidemia   . Fibromyalgia   . Back pain     History reviewed. No pertinent past surgical history.  History reviewed. No pertinent family history.  Social History:  reports that she does not drink alcohol. Her tobacco and drug histories are not on file.  Allergies:  Allergies  Allergen Reactions  . Other Other (See Comments)    Unknown anesthetic; unknown reaction; Anesthetic was given during back surgery    Medications: I have reviewed the patient's current medications.  Results for orders placed during the hospital encounter of 09/28/13 (from the past 48 hour(s))  GLUCOSE, CAPILLARY     Status: Abnormal   Collection Time    09/28/13  3:55 PM      Result Value Range   Glucose-Capillary 102 (*) 70 - 99 mg/dL   Comment 1 Notify RN     Comment 2 Documented in Chart    TYPE AND SCREEN     Status: None   Collection Time    09/28/13  4:24 PM      Result Value Range   ABO/RH(D) O POS     Antibody Screen NEG     Sample Expiration 10/01/2013    BASIC METABOLIC PANEL     Status: Abnormal   Collection Time    09/28/13  4:32 PM      Result Value Range   Sodium 141  135 - 145 mEq/L   Potassium 4.5  3.5 - 5.1 mEq/L   Chloride 102  96 - 112 mEq/L   CO2 26  19 - 32 mEq/L   Glucose, Bld  114 (*) 70 - 99 mg/dL   BUN 18  6 - 23 mg/dL   Creatinine, Ser 0.88  0.50 - 1.10 mg/dL   Calcium 10.8 (*) 8.4 - 10.5 mg/dL   GFR calc non Af Amer 68 (*) >90 mL/min   GFR calc Af Amer 79 (*) >90 mL/min   Comment: (NOTE)     The eGFR has been calculated using the CKD EPI equation.     This calculation has not been validated in all clinical situations.     eGFR's persistently <90 mL/min signify possible Chronic Kidney     Disease.  CBC WITH DIFFERENTIAL     Status: Abnormal   Collection Time    09/28/13  4:32 PM      Result Value Range   WBC 12.9 (*) 4.0 - 10.5 K/uL   RBC 3.89  3.87 - 5.11 MIL/uL   Hemoglobin 10.3 (*) 12.0 - 15.0 g/dL   HCT 32.5 (*) 36.0 - 46.0 %   MCV 83.5  78.0 - 100.0 fL   MCH 26.5  26.0 - 34.0 pg   MCHC 31.7  30.0 - 36.0 g/dL   RDW 14.3  11.5 -   15.5 %   Platelets 350  150 - 400 K/uL   Neutrophils Relative % 90 (*) 43 - 77 %   Neutro Abs 11.6 (*) 1.7 - 7.7 K/uL   Lymphocytes Relative 6 (*) 12 - 46 %   Lymphs Abs 0.8  0.7 - 4.0 K/uL   Monocytes Relative 2 (*) 3 - 12 %   Monocytes Absolute 0.3  0.1 - 1.0 K/uL   Eosinophils Relative 2  0 - 5 %   Eosinophils Absolute 0.2  0.0 - 0.7 K/uL   Basophils Relative 0  0 - 1 %   Basophils Absolute 0.0  0.0 - 0.1 K/uL  PROTIME-INR     Status: None   Collection Time    09/28/13  4:32 PM      Result Value Range   Prothrombin Time 12.6  11.6 - 15.2 seconds   INR 0.96  0.00 - 1.49  URINALYSIS, ROUTINE W REFLEX MICROSCOPIC     Status: Abnormal   Collection Time    09/28/13  5:57 PM      Result Value Range   Color, Urine YELLOW  YELLOW   APPearance CLEAR  CLEAR   Specific Gravity, Urine 1.020  1.005 - 1.030   pH 7.0  5.0 - 8.0   Glucose, UA NEGATIVE  NEGATIVE mg/dL   Hgb urine dipstick NEGATIVE  NEGATIVE   Bilirubin Urine NEGATIVE  NEGATIVE   Ketones, ur 15 (*) NEGATIVE mg/dL   Protein, ur NEGATIVE  NEGATIVE mg/dL   Urobilinogen, UA 1.0  0.0 - 1.0 mg/dL   Nitrite NEGATIVE  NEGATIVE   Leukocytes, UA NEGATIVE   NEGATIVE   Comment: MICROSCOPIC NOT DONE ON URINES WITH NEGATIVE PROTEIN, BLOOD, LEUKOCYTES, NITRITE, OR GLUCOSE <1000 mg/dL.    Dg Chest 1 View  09/28/2013   CLINICAL DATA:  Fall, cough, congestion  EXAM: CHEST - 1 VIEW  COMPARISON:  09/09/2011  FINDINGS: Normal heart size for technique. No focal pneumonia, collapse or consolidation. No effusion or pneumothorax. Trachea midline. Degenerative changes of the spine.  IMPRESSION: No acute chest findings.   Electronically Signed   By: Trevor  Shick M.D.   On: 09/28/2013 17:08   Dg Hip Complete Left  09/28/2013   CLINICAL DATA:  Left hip pain post fall.  EXAM: LEFT HIP - COMPLETE 2+ VIEW  COMPARISON:  11/09/2009  FINDINGS: Comminuted left intertrochanteric and proximal diaphyseal femur fracture, with impaction and override of major fracture fragments. No dislocation or intra-articular involvement of the fracture. Changes of PLIF L4-5.  IMPRESSION: 1. Comminuted left intertrochanteric and proximal diaphyseal femur fracture   Electronically Signed   By: Daniel  Hassell M.D.   On: 09/28/2013 17:13    Review of Systems  Musculoskeletal:       Left hip pain, weakness  All other systems reviewed and are negative.   Blood pressure 129/66, pulse 77, temperature 98.3 F (36.8 C), temperature source Oral, resp. rate 14, SpO2 96.00%. Physical Exam  Constitutional: She is oriented to person, place, and time. She appears well-developed and well-nourished.  HENT:  Head: Normocephalic and atraumatic.  Eyes: EOM are normal.  Cardiovascular: Normal rate and intact distal pulses.   Respiratory: Effort normal.  Musculoskeletal:  L LE shorted and externally rotated. Left hip with decreased ROM and pain limiting exam. Left hip TTP. Wiggles toes, distally NVI.  Knee/ankle with full rom, nontender  Neurological: She is alert and oriented to person, place, and time.  Skin: Skin is warm and dry.  Psychiatric:   She has a normal mood and affect. Her behavior is  normal. Judgment and thought content normal.    Assessment/Plan: Left comminuted left intertochanteric and proximal diaphyseal femur fracture Discussed with patient dx and tx options, surgical procedure and post operative course. To maximize function, achieve quicker ambulatory status, surgical fixation it determined necessary Will plan for left IM trochanteric nail tomorrow after 11am NPO after midnight, [facility] L LE hospitalist will admit, we appreciate their help   Emon Lance 09/28/2013, 7:38 PM      

## 2013-09-29 NOTE — H&P (View-Only) (Signed)
Agree with above. Comminuted proximal L femur fracture.  Plan for IMN tomorrow.  Risks /benefits discussed at length.  NPO past midnight.

## 2013-09-29 NOTE — Progress Notes (Signed)
Orthopedic Tech Progress Note Patient Details:  [name] 02-02-1951 [id number]  Ortho Devices Ortho Device/Splint Location: put on overhead frame on bed Ortho Device/Splint Interventions: Ordered;Application   Jennye Moccasin 09/29/2013, 4:57 PM

## 2013-09-29 NOTE — Preoperative (Signed)
Beta Blockers   Reason not to administer Beta Blockers:Not Applicable 

## 2013-09-29 NOTE — Anesthesia Procedure Notes (Signed)
Procedure Name: Intubation Date/Time: 09/29/2013 11:24 AM Performed by: Carmela Rima Pre-anesthesia Checklist: Patient identified, Timeout performed, Emergency Drugs available, Suction available and Patient being monitored Patient Re-evaluated:Patient Re-evaluated prior to inductionOxygen Delivery Method: Circle system utilized Preoxygenation: Pre-oxygenation with 100% oxygen Intubation Type: IV induction Ventilation: Mask ventilation without difficulty Laryngoscope Size: Mac and 3 Grade View: Grade III Tube type: Oral Tube size: 7.5 mm Number of attempts: 2 Airway Equipment and Method: Video-laryngoscopy Placement Confirmation: positive ETCO2,  ETT inserted through vocal cords under direct vision and breath sounds checked- equal and bilateral Secured at: 22 cm Tube secured with: Tape Dental Injury: Teeth and Oropharynx as per pre-operative assessment

## 2013-09-29 NOTE — Interval H&P Note (Signed)
History and Physical Interval Note:  09/29/2013 11:06 AM  [name]  has presented today for surgery, with the diagnosis of Left Intertroch fracture  The various methods of treatment have been discussed with the patient and family. After consideration of risks, benefits and other options for treatment, the patient has consented to  Procedure(s): INTRAMEDULLARY (IM) NAIL INTERTROCHANTRIC (Left) as a surgical intervention .  The patient's history has been reviewed, patient examined, no change in status, stable for surgery.  I have reviewed the patient's chart and labs.  Questions were answered to the patient's satisfaction.     Mable Paris

## 2013-09-29 NOTE — Anesthesia Postprocedure Evaluation (Signed)
  Anesthesia Post-op Note  Patient: [name]  Procedure(s) Performed: Procedure(s): INTRAMEDULLARY (IM) NAIL INTERTROCHANTRIC (Left)  Patient Location: PACU  Anesthesia Type:General  Level of Consciousness: awake, alert  and oriented  Airway and Oxygen Therapy: Patient Spontanous Breathing  Post-op Pain: mild  Post-op Assessment: Post-op Vital signs reviewed  Post-op Vital Signs: Reviewed  Complications: No apparent anesthesia complications

## 2013-09-29 NOTE — Progress Notes (Signed)
Chart reviewed.  Subjective: No pain or nausea postop  Objective: Vital signs in last 24 hours: Temp:  [97.1 F (36.2 C)-98.9 F (37.2 C)] 98 F (36.7 C) (10/30 1453) Pulse Rate:  [60-101] 75 (10/30 1453) Resp:  [10-18] 18 (10/30 1558) BP: (98-183)/(48-79) 98/48 mmHg (10/30 1453) SpO2:  [92 %-100 %] 95 % (10/30 1558) Weight:  [77.111 kg (170 lb)] 77.111 kg (170 lb) (10/30 0354) Weight change:  Last BM Date: 09/28/13  Intake/Output from previous day: 10/29 0701 - 10/30 0700 In: 600 [I.V.:600] Out: 750 [Urine:750] Intake/Output this shift: Total I/O In: 1250 [I.V.:1250] Out: 1075 [Urine:800; Blood:275]  Tele: NSR  Gen: awake, but groggy. Oriented and appropriate Lungs CTA without WRR CV RRR without MGR Abd S, NT, ND Ext no CCE  Lab Results:  Recent Labs  09/28/13 1632 09/29/13 0510  WBC 12.9* 16.4*  HGB 10.3* 9.3*  HCT 32.5* 29.2*  PLT 350 322   BMET  Recent Labs  09/28/13 1632 09/29/13 0510  NA 141 141  K 4.5 5.0  CL 102 106  CO2 26 26  GLUCOSE 114* 126*  BUN 18 20  CREATININE 0.88 0.78  CALCIUM 10.8* 10.0    Studies/Results: Dg Chest 1 View  09/28/2013   CLINICAL DATA:  Fall, cough, congestion  EXAM: CHEST - 1 VIEW  COMPARISON:  09/09/2011  FINDINGS: Normal heart size for technique. No focal pneumonia, collapse or consolidation. No effusion or pneumothorax. Trachea midline. Degenerative changes of the spine.  IMPRESSION: No acute chest findings.   Electronically Signed   By: Ruel Favors M.D.   On: 09/28/2013 17:08   Dg Hip Complete Left  09/28/2013   CLINICAL DATA:  Left hip pain post fall.  EXAM: LEFT HIP - COMPLETE 2+ VIEW  COMPARISON:  11/09/2009  FINDINGS: Comminuted left intertrochanteric and proximal diaphyseal femur fracture, with impaction and override of major fracture fragments. No dislocation or intra-articular involvement of the fracture. Changes of PLIF L4-5.  IMPRESSION: 1. Comminuted left intertrochanteric and proximal diaphyseal  femur fracture   Electronically Signed   By: Oley Balm M.D.   On: 09/28/2013 17:13   Dg Femur Left  09/29/2013   CLINICAL DATA:  ORIF left intertrochanteric fracture  EXAM: LEFT FEMUR - 2 VIEW; DG C-ARM 1-60 MIN  COMPARISON:  Preoperative radiographs 09/28/2013  FINDINGS: A total of 4 intraoperative spot radiographs demonstrate open reduction and internal fixation of the previously described comminuted intertrochanteric fracture with and Affixus hip fracture nail system. Alignment of the fracture fragments is improved compared to the preoperative radiographs. No evidence of immediate hardware complication.  IMPRESSION: ORIF of comminuted intertrochanteric fracture without evidence of immediate hardware complication.  Alignment of the fracture fragments is improved compared to the preoperative radiographs.   Electronically Signed   By: Malachy Moan M.D.   On: 09/29/2013 16:14   Dg Femur Left Port  09/29/2013   CLINICAL DATA:  Left intramedullary nail placement.  EXAM: PORTABLE LEFT FEMUR - 2 VIEW  COMPARISON:  09/28/2013  FINDINGS: Left femoral intramedullary nail observe traversing the mildly comminuted intertrochanteric fracture. Separate lesser trochanteric fragment noted. Near-anatomic alignment and positioning of the dominant fracture fragments compared to preoperative images. Single distal interlocking screw noted along the nail.  IMPRESSION: 1. Left intramedullary nail placement, traversing the mildly comminuted intertrochanteric fracture. No complicating feature.   Electronically Signed   By: Herbie Baltimore M.D.   On: 09/29/2013 15:19   Dg C-arm 1-60 Min  09/29/2013   CLINICAL DATA:  ORIF left intertrochanteric fracture  EXAM: LEFT FEMUR - 2 VIEW; DG C-ARM 1-60 MIN  COMPARISON:  Preoperative radiographs 09/28/2013  FINDINGS: A total of 4 intraoperative spot radiographs demonstrate open reduction and internal fixation of the previously described comminuted intertrochanteric fracture  with and Affixus hip fracture nail system. Alignment of the fracture fragments is improved compared to the preoperative radiographs. No evidence of immediate hardware complication.  IMPRESSION: ORIF of comminuted intertrochanteric fracture without evidence of immediate hardware complication.  Alignment of the fracture fragments is improved compared to the preoperative radiographs.   Electronically Signed   By: Malachy Moan M.D.   On: 09/29/2013 16:14    Medications:  Scheduled Meds: . sodium chloride   Intravenous Once  . aspirin EC  325 mg Oral BID  .  ceFAZolin (ANCEF) IV  2 g Intravenous Q6H  . HYDROmorphone      . HYDROmorphone      . insulin aspart  0-9 Units Subcutaneous TID WC  . lisinopril  20 mg Oral Daily  . sertraline  100 mg Oral QPM   Continuous Infusions: . sodium chloride 50 mL/hr at 09/28/13 1605   PRN Meds:.acetaminophen, acetaminophen, ALPRAZolam, HYDROcodone-acetaminophen, HYDROcodone-acetaminophen, HYDROmorphone (DILAUDID) injection, menthol-cetylpyridinium, metoCLOPramide (REGLAN) injection, metoCLOPramide, morphine injection, morphine injection, ondansetron (ZOFRAN) IV, ondansetron, oxyCODONE, phenol  Assessment/Plan: Active Problems:   Closed left hip fracture   Hypertension   Diabetes mellitus  Doing well post op.  D/C tele.   LOS: 1 day   Lilliam Chamblee L 09/29/2013, 4:20 PM

## 2013-09-29 NOTE — Anesthesia Preprocedure Evaluation (Addendum)
Anesthesia Evaluation  Patient identified by MRN, date of birth, ID band Patient awake    Reviewed: Allergy & Precautions, H&P , NPO status , Patient's Chart, lab work & pertinent test results  Airway Mallampati: II TM Distance: >3 FB     Dental  (+) Teeth Intact and Dental Advidsory Given   Pulmonary  breath sounds clear to auscultation        Cardiovascular hypertension, Rhythm:Regular Rate:Normal     Neuro/Psych Chronic back pain S/P PLIF  Neuromuscular disease    GI/Hepatic   Endo/Other  diabetes  Renal/GU      Musculoskeletal  (+) Fibromyalgia -  Abdominal (+) + obese,   Peds  Hematology   Anesthesia Other Findings   Reproductive/Obstetrics                         Anesthesia Physical Anesthesia Plan  ASA: III  Anesthesia Plan: General   Post-op Pain Management:    Induction: Intravenous  Airway Management Planned: Oral ETT  Additional Equipment:   Intra-op Plan:   Post-operative Plan: Extubation in OR  Informed Consent: I have reviewed the patients History and Physical, chart, labs and discussed the procedure including the risks, benefits and alternatives for the proposed anesthesia with the patient or authorized representative who has indicated his/her understanding and acceptance.   Dental Advisory Given  Plan Discussed with: CRNA, Surgeon and Anesthesiologist  Anesthesia Plan Comments:        Anesthesia Quick Evaluation

## 2013-09-29 NOTE — Progress Notes (Signed)
PATIENT ID: Winifred Olive     Procedure(s) (LRB): INTRAMEDULLARY (IM) NAIL INTERTROCHANTRIC (Left)  Subjective: Reports moderate pain in left hip, fairly controlled with pain medication.  Objective:  Filed Vitals:   09/29/13 0800  BP:   Pulse:   Temp:   Resp: 18     Awake, alert, orientated L hip TTP Wiggles toes, distally NVI  Labs:   Recent Labs  09/28/13 1632 09/29/13 0510  HGB 10.3* 9.3*   Recent Labs  09/28/13 1632 09/29/13 0510  WBC 12.9* 16.4*  RBC 3.89 3.51*  HCT 32.5* 29.2*  PLT 350 322   Recent Labs  09/28/13 1632 09/29/13 0510  NA 141 141  K 4.5 5.0  CL 102 106  CO2 26 26  BUN 18 20  CREATININE 0.88 0.78  GLUCOSE 114* 126*  CALCIUM 10.8* 10.0    Assessment and Plan: Comminuted proximal L femur fracture, plan for intertrochanteric IM nail this am. schd for 11am NPO   VTE proph: ASA 325mg  BID after surgery

## 2013-09-29 NOTE — Transfer of Care (Signed)
Immediate Anesthesia Transfer of Care Note  Patient: [name]  Procedure(s) Performed: Procedure(s): INTRAMEDULLARY (IM) NAIL INTERTROCHANTRIC (Left)  Patient Location: PACU  Anesthesia Type:General  Level of Consciousness: awake, alert  and oriented  Airway & Oxygen Therapy: Patient Spontanous Breathing and Patient connected to nasal cannula oxygen  Post-op Assessment: Report given to PACU RN, Post -op Vital signs reviewed and stable and Patient moving all extremities X 4  Post vital signs: Reviewed and stable  Complications: No apparent anesthesia complications

## 2013-09-30 ENCOUNTER — Encounter (HOSPITAL_COMMUNITY): Payer: Self-pay | Admitting: Orthopedic Surgery

## 2013-09-30 LAB — GLUCOSE, CAPILLARY
Glucose-Capillary: 115 mg/dL — ABNORMAL HIGH (ref 70–99)
Glucose-Capillary: 119 mg/dL — ABNORMAL HIGH (ref 70–99)
Glucose-Capillary: 122 mg/dL — ABNORMAL HIGH (ref 70–99)

## 2013-09-30 LAB — BASIC METABOLIC PANEL
BUN: 11 mg/dL (ref 6–23)
CO2: 27 mEq/L (ref 19–32)
Calcium: 9.3 mg/dL (ref 8.4–10.5)
GFR calc non Af Amer: 73 mL/min — ABNORMAL LOW (ref >90)
Glucose, Bld: 126 mg/dL — ABNORMAL HIGH (ref 70–99)
Potassium: 4.8 mEq/L (ref 3.5–5.1)
Sodium: 141 mEq/L (ref 135–145)

## 2013-09-30 LAB — CBC
HCT: 23.7 % — ABNORMAL LOW (ref 36.0–46.0)
Hemoglobin: 7.7 g/dL — ABNORMAL LOW (ref 12.0–15.0)
MCH: 27.2 pg (ref 26.0–34.0)
MCHC: 32.5 g/dL (ref 30.0–36.0)
RBC: 2.83 MIL/uL — ABNORMAL LOW (ref 3.87–5.11)
WBC: 10.6 10*3/uL — ABNORMAL HIGH (ref 4.0–10.5)

## 2013-09-30 MED ORDER — HYDROCODONE-ACETAMINOPHEN 5-325 MG PO TABS: 1.0000 | ORAL_TABLET | ORAL | Status: DC | PRN

## 2013-09-30 MED ORDER — ASPIRIN EC 325 MG PO TBEC: 325.0000 mg | DELAYED_RELEASE_TABLET | Freq: Two times a day (BID) | ORAL | Status: DC

## 2013-09-30 NOTE — Progress Notes (Signed)
PATIENT ID: [name]   1 Day Post-Op Procedure(s) (LRB): INTRAMEDULLARY (IM) NAIL INTERTROCHANTRIC (Left)  Subjective: Feeling tired and lightheaded this morning. Reports pain well controlled when lying, worse with mvmt. Notes overall feeling well.   Objective:  Filed Vitals:   09/30/13 0825  BP: 83/46  Pulse: 99  Temp: 99.3 F (37.4 C)  Resp: 18     L hip with some ecchymosis, swelling Dressings c/d/i Wiggles toes, distally NVI  Labs:   Recent Labs  09/28/13 1632 09/29/13 0510 09/30/13 0538  HGB 10.3* 9.3* 7.7*   Recent Labs  09/29/13 0510 09/30/13 0538  WBC 16.4* 10.6*  RBC 3.51* 2.83*  HCT 29.2* 23.7*  PLT 322 251   Recent Labs  09/29/13 0510 09/30/13 0538  NA 141 141  K 5.0 4.8  CL 106 106  CO2 26 27  BUN 20 11  CREATININE 0.78 0.83  GLUCOSE 126* 126*  CALCIUM 10.0 9.3    Assessment and Plan: 1 day s/p left IM intertroch nail  ABLA- symptomatic, expected postoperatively, will transfuse two units packed RBC this am  Minimize pain medication post operatively as much as possible WBAT, will get up with PT today norco for home pain control, script in chart. ASA for home DVT prophylaxis  VTE proph: ASA 325mg  BID, SCDs

## 2013-09-30 NOTE — Progress Notes (Signed)
Subjective: Some pain. Weak. Feels better after transfusion.   Objective: Vital signs in last 24 hours: Temp:  [98.3 F (36.8 C)-100 F (37.8 C)] 99.5 F (37.5 C) (10/31 1815) Pulse Rate:  [65-117] 85 (10/31 1815) Resp:  [14-18] 14 (10/31 1815) BP: (83-128)/(45-73) 91/45 mmHg (10/31 1815) SpO2:  [88 %-100 %] 96 % (10/31 1815) Weight change:  Last BM Date: 09/28/13  Intake/Output from previous day: 10/30 0701 - 10/31 0700 In: 2400 [P.O.:240; I.V.:2160] Out: 2925 [Urine:2650; Blood:275] Intake/Output this shift:    Gen: alert and oriented. Lungs CTA without WRR CV RRR without MGR Abd S, NT, ND Ext no CCE  Lab Results:  Recent Labs  09/29/13 0510 09/30/13 0538  WBC 16.4* 10.6*  HGB 9.3* 7.7*  HCT 29.2* 23.7*  PLT 322 251   BMET  Recent Labs  09/29/13 0510 09/30/13 0538  NA 141 141  K 5.0 4.8  CL 106 106  CO2 26 27  GLUCOSE 126* 126*  BUN 20 11  CREATININE 0.78 0.83  CALCIUM 10.0 9.3    Studies/Results: Dg Femur Left  09/29/2013   CLINICAL DATA:  ORIF left intertrochanteric fracture  EXAM: LEFT FEMUR - 2 VIEW; DG C-ARM 1-60 MIN  COMPARISON:  Preoperative radiographs 09/28/2013  FINDINGS: A total of 4 intraoperative spot radiographs demonstrate open reduction and internal fixation of the previously described comminuted intertrochanteric fracture with and Affixus hip fracture nail system. Alignment of the fracture fragments is improved compared to the preoperative radiographs. No evidence of immediate hardware complication.  IMPRESSION: ORIF of comminuted intertrochanteric fracture without evidence of immediate hardware complication.  Alignment of the fracture fragments is improved compared to the preoperative radiographs.   Electronically Signed   By: Malachy Moan M.D.   On: 09/29/2013 16:14   Dg Femur Left Port  09/29/2013   CLINICAL DATA:  Left intramedullary nail placement.  EXAM: PORTABLE LEFT FEMUR - 2 VIEW  COMPARISON:  09/28/2013  FINDINGS:  Left femoral intramedullary nail observe traversing the mildly comminuted intertrochanteric fracture. Separate lesser trochanteric fragment noted. Near-anatomic alignment and positioning of the dominant fracture fragments compared to preoperative images. Single distal interlocking screw noted along the nail.  IMPRESSION: 1. Left intramedullary nail placement, traversing the mildly comminuted intertrochanteric fracture. No complicating feature.   Electronically Signed   By: Herbie Baltimore M.D.   On: 09/29/2013 15:19   Dg C-arm 1-60 Min  09/29/2013   CLINICAL DATA:  ORIF left intertrochanteric fracture  EXAM: LEFT FEMUR - 2 VIEW; DG C-ARM 1-60 MIN  COMPARISON:  Preoperative radiographs 09/28/2013  FINDINGS: A total of 4 intraoperative spot radiographs demonstrate open reduction and internal fixation of the previously described comminuted intertrochanteric fracture with and Affixus hip fracture nail system. Alignment of the fracture fragments is improved compared to the preoperative radiographs. No evidence of immediate hardware complication.  IMPRESSION: ORIF of comminuted intertrochanteric fracture without evidence of immediate hardware complication.  Alignment of the fracture fragments is improved compared to the preoperative radiographs.   Electronically Signed   By: Malachy Moan M.D.   On: 09/29/2013 16:14    Medications:  Scheduled Meds: . aspirin EC  325 mg Oral BID  . insulin aspart  0-9 Units Subcutaneous TID WC  . sertraline  100 mg Oral QPM   Continuous Infusions: . sodium chloride 50 mL/hr at 09/28/13 1605   PRN Meds:.acetaminophen, acetaminophen, ALPRAZolam, HYDROcodone-acetaminophen, HYDROcodone-acetaminophen, HYDROmorphone (DILAUDID) injection, menthol-cetylpyridinium, metoCLOPramide (REGLAN) injection, metoCLOPramide, morphine injection, morphine injection, ondansetron (ZOFRAN) IV, ondansetron, oxyCODONE, phenol  Assessment/Plan: Active Problems:   Closed left hip fracture    Hypertension   Diabetes mellitus postop anemia: agree with transfusion To SNF when cleared by ortho  LOS: 2 days   Audery Wassenaar L 09/30/2013, 7:51 PM

## 2013-09-30 NOTE — Progress Notes (Signed)
Attempted to see pt this am.  Pt not feeling well.  Hgb is 7.7 and pt to receive blood.  Will return as schedule allows this afternoon or Sat. Tory Emerald, [place] [phone]

## 2013-09-30 NOTE — Evaluation (Signed)
Physical Therapy Evaluation Patient Details Name: [name] MRN: [id number] DOB: [birth year] Today's Date: 09/30/2013 Time: 5784-6962 PT Time Calculation (min): 24 min  PT Assessment / Plan / Recommendation History of Present Illness  Pt is a 62 y/o female admitted s/p intertrochanteric IM nailing.  Clinical Impression  Pt presents with acute pain and decreased functional independence following the above mentioned procedure. At the time of PT eval, the patient required increased time to perform all activity, especially ambulation. +2 assist helpful with transfer to EOB and sit>stand. This patient is appropriate for skilled PT interventions to address functional limitations, improve safety and independence with functional mobility, and return to PLOF.    PT Assessment  Patient needs continued PT services    Follow Up Recommendations  SNF    Does the patient have the potential to tolerate intense rehabilitation      Barriers to Discharge        Equipment Recommendations  None recommended by PT    Recommendations for Other Services     Frequency Min 5X/week    Precautions / Restrictions Precautions Precautions: Fall Restrictions Weight Bearing Restrictions: Yes LLE Weight Bearing: Weight bearing as tolerated   Pertinent Vitals/Pain 8/10 after ambulation. Pt repositioned for comfort.      Mobility  Bed Mobility Bed Mobility: Supine to Sit;Sitting - Scoot to Edge of Bed Supine to Sit: 1: +2 Total assist Supine to Sit: Patient Percentage: 40% Sitting - Scoot to Edge of Bed: 3: Mod assist (with pad) Details for Bed Mobility Assistance: Pt required assist for LLE movement to EOB, as well as for support when scooting to EOB. Bed pad was used to assist pt to scoot trunk to edge. Transfers Transfers: Sit to Stand;Stand to Sit;Stand Pivot Transfers Sit to Stand: 1: +2 Total assist;From bed;With upper extremity assist Sit to Stand: Patient Percentage: 50% Stand to Sit: 4:  Min assist;To chair/3-in-1;With upper extremity assist Stand Pivot Transfers: 3: Mod assist Details for Transfer Assistance: VC's for hand placement on seated surface prior to initiating transfers. Pt requires cueing for feet placement as well as heavier assist to power up. Ambulation/Gait Ambulation/Gait Assistance: 3: Mod assist Ambulation Distance (Feet): 5 Feet Assistive device: Rolling walker Ambulation/Gait Assistance Details: Pt moves very slowly with ambulation, requiring assist to advance LLE and place walker in proper position during turns. Mod assist to unweight trunk when advancing R LE. Gait Pattern: Step-to pattern;Decreased stride length Gait velocity: Significantly decreased Stairs: No    Exercises General Exercises - Lower Extremity Ankle Circles/Pumps: 10 reps Quad Sets: 10 reps   PT Diagnosis: Difficulty walking;Acute pain  PT Problem List: Decreased strength;Decreased range of motion;Decreased activity tolerance;Decreased balance;Decreased mobility;Decreased knowledge of use of DME;Decreased safety awareness;Pain PT Treatment Interventions: DME instruction;Gait training;Stair training;Functional mobility training;Therapeutic activities;Therapeutic exercise;Neuromuscular re-education;Patient/family education     PT Goals(Current goals can be found in the care plan section) Acute Rehab PT Goals Patient Stated Goal: To d/c to SNF for continued rehab PT Goal Formulation: With patient Time For Goal Achievement: 10/07/13 Potential to Achieve Goals: Fair  Visit Information  Last PT Received On: 09/30/13 Assistance Needed: +2 History of Present Illness: Pt is a 62 y/o female admitted s/p intertrochanteric IM nailing.       Prior Functioning  Home Living Family/patient expects to be discharged to:: Skilled nursing facility Living Arrangements: Alone Prior Function Level of Independence: Independent Communication Communication: No difficulties Dominant Hand: Right     Cognition  Cognition Arousal/Alertness: Awake/alert Behavior During Therapy: [facility]  for tasks assessed/performed Overall Cognitive Status: Within Functional Limits for tasks assessed    Extremity/Trunk Assessment Upper Extremity Assessment Upper Extremity Assessment: Overall WFL for tasks assessed Lower Extremity Assessment Lower Extremity Assessment: LLE deficits/detail LLE Deficits / Details: Decreased strength and AROM consistent with above mentioned procedure. Cervical / Trunk Assessment Cervical / Trunk Assessment: Normal   Balance Balance Balance Assessed: Yes  End of Session PT - End of Session Equipment Utilized During Treatment: Gait belt Activity Tolerance: Patient limited by pain;Patient limited by fatigue Patient left: in chair;with call bell/phone within reach Nurse Communication: Mobility status  GP     Ruthann Cancer 09/30/2013, 2:53 PM  Ruthann Cancer, PT, DPT [phone]

## 2013-10-01 LAB — BASIC METABOLIC PANEL
GFR calc Af Amer: >90 mL/min (ref >90)
GFR calc non Af Amer: 87 mL/min — ABNORMAL LOW (ref >90)
Glucose, Bld: 124 mg/dL — ABNORMAL HIGH (ref 70–99)
Potassium: 4.6 mEq/L (ref 3.5–5.1)
Sodium: 143 mEq/L (ref 135–145)

## 2013-10-01 LAB — GLUCOSE, CAPILLARY
Glucose-Capillary: 112 mg/dL — ABNORMAL HIGH (ref 70–99)
Glucose-Capillary: 106 mg/dL — ABNORMAL HIGH (ref 70–99)
Glucose-Capillary: 130 mg/dL — ABNORMAL HIGH (ref 70–99)
Glucose-Capillary: 157 mg/dL — ABNORMAL HIGH (ref 70–99)

## 2013-10-01 LAB — TYPE AND SCREEN
ABO/RH(D): O POS
Antibody Screen: NEGATIVE
Unit division: 0
Unit division: 0

## 2013-10-01 LAB — CBC
Hemoglobin: 9.4 g/dL — ABNORMAL LOW (ref 12.0–15.0)
MCHC: 32.5 g/dL (ref 30.0–36.0)
RBC: 3.43 MIL/uL — ABNORMAL LOW (ref 3.87–5.11)
RDW: 14.8 % (ref 11.5–15.5)

## 2013-10-01 MED ORDER — FLUCONAZOLE 150 MG PO TABS
150.0000 mg | ORAL_TABLET | Freq: Once | ORAL | Status: AC
  Administered 2013-10-01: 150 mg via ORAL
  Filled 2013-10-01: qty 1

## 2013-10-01 MED ORDER — SENNOSIDES-DOCUSATE SODIUM 8.6-50 MG PO TABS
2.0000 | ORAL_TABLET | Freq: Every day | ORAL | Status: DC
  Administered 2013-10-01 – 2013-10-02 (×2): 2 via ORAL
  Filled 2013-10-01 (×3): qty 2

## 2013-10-01 NOTE — Progress Notes (Signed)
OT Cancellation Note  Patient Details Name: [name] MRN: [id number] DOB: 12/31/1950   Cancelled Treatment:    Reason Eval/Treat Not Completed: Patient declined, had just returned to bed and did not want to participate in OT at this time. Will re-attempt next date.  10/01/2013 Cipriano Mile OTR/L Pager [phone] Office [phone]

## 2013-10-01 NOTE — Progress Notes (Signed)
Clinical Social Work Department BRIEF PSYCHOSOCIAL ASSESSMENT 10/01/2013  Patient:  [name], [name]     Account Number:  [id number]     Admit date:  09/28/2013  Clinical Social Worker:  Hendricks Milo  Date/Time:  10/01/2013 05:32 PM  Referred by:  Physician  Date Referred:  10/01/2013 Referred for  SNF Placement   Other Referral:   Interview type:  Patient Other interview type:    PSYCHOSOCIAL DATA Living Status:  ALONE Admitted from facility:   Level of care:   Primary support name:  Pearline Cables Primary support relationship to patient:  SIBLING Degree of support available:   Very supportive.    CURRENT CONCERNS  Other Concerns:    SOCIAL WORK ASSESSMENT / PLAN Clinical Social Worker (CSW) met with patient to discuss SNF placement. Patient reported that CIR is her first choice. CSW explained that CIR patient's have to be able to complete three hours of therapy a day. CSW also explained to patient that sometimes insurance companies won't cover CIR but will cover SNF because CIR is more expensive. Patient verbalized her understanding. Patient was agreeable to SNF search in [facility] as a back up to CIR. Patient's first SNF choice is [facility]. CSW made patient aware that PT is recommending SNF at this point.    CSW spoke with RN who reported that patient is not eligible for CIR based on the amount of therapy she can handle right now. RN reported that she would tell PT to explain to patient why she is inappropriate for CIR when they work with her tomorrow 11/2.   Assessment/plan status:  Psychosocial Support/Ongoing Assessment of Needs Other assessment/ plan:   Information/referral to community resources:   CSW gave patient SNF list.    PATIENT'S/FAMILY'S RESPONSE TO PLAN OF CARE: Patient was appreciative of CSW visit.

## 2013-10-01 NOTE — Progress Notes (Signed)
PT Cancellation Note  Patient Details Name: [name] MRN: [id number] DOB: Apr 17, 1951   Cancelled Treatment:     Pt declined, had just returned to bed & wanted to rest.  Pt also requesting for pain medication.  RN notified.      Verdell Face, [place] [phone] 10/01/2013

## 2013-10-01 NOTE — Progress Notes (Addendum)
PATIENT ID: [name]  MRN: [id number]  DOB/AGE:  02-05-1951 / 62 y.o.  2 Days Post-Op Procedure(s) (LRB): INTRAMEDULLARY (IM) NAIL INTERTROCHANTRIC (Left)    PROGRESS NOTE Subjective:   Patient is alert, oriented, no Nausea, no Vomiting, yes passing gas, no Bowel Movement. Taking PO well. Denies SOB, Chest or Calf Pain. Using Incentive Spirometer, PAS in place. Ambulate WBAT, Patient reports pain as moderate,     Objective: Vital signs in last 24 hours: Temp:  [98.2 F (36.8 C)-100 F (37.8 C)] 98.3 F (36.8 C) (11/01 0428) Pulse Rate:  [71-117] 78 (11/01 0428) Resp:  [14-18] 18 (11/01 0428) BP: (90-146)/(45-73) 146/65 mmHg (11/01 0428) SpO2:  [95 %-100 %] 95 % (11/01 0428)    Intake/Output from previous day: I/O last 3 completed shifts: In: 2425 [P.O.:840; I.V.:910; Blood:675] Out: 2200 [Urine:2200]   Intake/Output this shift:     LABORATORY DATA:  Recent Labs  09/28/13 1632  09/30/13 0538  09/30/13 1600 09/30/13 2155 10/01/13 0353 10/01/13 0643  WBC 12.9*  < > 10.6*  --   --   --  9.3  --   HGB 10.3*  < > 7.7*  --   --   --  9.4*  --   HCT 32.5*  < > 23.7*  --   --   --  28.9*  --   PLT 350  < > 251  --   --   --  203  --   NA 141  < > 141  --   --   --  143  --   K 4.5  < > 4.8  --   --   --  4.6  --   CL 102  < > 106  --   --   --  110  --   CO2 26  < > 27  --   --   --  27  --   BUN 18  < > 11  --   --   --  11  --   CREATININE 0.88  < > 0.83  --   --   --  0.78  --   GLUCOSE 114*  < > 126*  --   --   --  124*  --   GLUCAP  --   < >  --   < > 119* 122*  --  112*  INR 0.96  --   --   --   --   --   --   --   CALCIUM 10.8*  < > 9.3  --   --   --  9.6  --   < > = values in this interval not displayed.  Examination: Neurologically intact Neurovascular intact Sensation intact distally Intact pulses distally Dorsiflexion/Plantar flexion intact Incision: no drainage No cellulitis present Compartment soft}  Assessment:   2 Days Post-Op Procedure(s)  (LRB): INTRAMEDULLARY (IM) NAIL INTERTROCHANTRIC (Left) ADDITIONAL DIAGNOSIS:   Postop anemia improved  Plan: Weightbearing as tolerated  DVT Prophylaxis:  Aspirin 325 mg BID, SCDs  DISCHARGE PLAN: Skilled Nursing Facility/Rehab  DISCHARGE NEEDS: HHPT, HHRN, Walker and 3-in-1 comode seat     PHILLIPS, ERIC R 10/01/2013, 8:51 AM

## 2013-10-01 NOTE — Progress Notes (Signed)
Subjective: No new complaints  Objective: Vital signs in last 24 hours: Temp:  [98.2 F (36.8 C)-100 F (37.8 C)] 98.3 F (36.8 C) (11/01 0428) Pulse Rate:  [71-117] 78 (11/01 0428) Resp:  [14-18] 16 (11/01 0857) BP: (90-146)/(45-73) 110/68 mmHg (11/01 0857) SpO2:  [95 %-100 %] 95 % (11/01 0428) Weight change:  Last BM Date: 09/28/13  Intake/Output from previous day: 10/31 0701 - 11/01 0700 In: 1275 [P.O.:600; Blood:675] Out: 600 [Urine:600] Intake/Output this shift:    Gen: alert and oriented. In chair Lungs CTA without WRR CV RRR without MGR Abd S, NT, ND Ext no CCE  Lab Results:  Recent Labs  09/30/13 0538 10/01/13 0353  WBC 10.6* 9.3  HGB 7.7* 9.4*  HCT 23.7* 28.9*  PLT 251 203   BMET  Recent Labs  09/30/13 0538 10/01/13 0353  NA 141 143  K 4.8 4.6  CL 106 110  CO2 27 27  GLUCOSE 126* 124*  BUN 11 11  CREATININE 0.83 0.78  CALCIUM 9.3 9.6    Studies/Results: Dg Femur Left  09/29/2013   CLINICAL DATA:  ORIF left intertrochanteric fracture  EXAM: LEFT FEMUR - 2 VIEW; DG C-ARM 1-60 MIN  COMPARISON:  Preoperative radiographs 09/28/2013  FINDINGS: A total of 4 intraoperative spot radiographs demonstrate open reduction and internal fixation of the previously described comminuted intertrochanteric fracture with and Affixus hip fracture nail system. Alignment of the fracture fragments is improved compared to the preoperative radiographs. No evidence of immediate hardware complication.  IMPRESSION: ORIF of comminuted intertrochanteric fracture without evidence of immediate hardware complication.  Alignment of the fracture fragments is improved compared to the preoperative radiographs.   Electronically Signed   By: Malachy Moan M.D.   On: 09/29/2013 16:14   Dg Femur Left Port  09/29/2013   CLINICAL DATA:  Left intramedullary nail placement.  EXAM: PORTABLE LEFT FEMUR - 2 VIEW  COMPARISON:  09/28/2013  FINDINGS: Left femoral intramedullary nail observe  traversing the mildly comminuted intertrochanteric fracture. Separate lesser trochanteric fragment noted. Near-anatomic alignment and positioning of the dominant fracture fragments compared to preoperative images. Single distal interlocking screw noted along the nail.  IMPRESSION: 1. Left intramedullary nail placement, traversing the mildly comminuted intertrochanteric fracture. No complicating feature.   Electronically Signed   By: Herbie Baltimore M.D.   On: 09/29/2013 15:19   Dg C-arm 1-60 Min  09/29/2013   CLINICAL DATA:  ORIF left intertrochanteric fracture  EXAM: LEFT FEMUR - 2 VIEW; DG C-ARM 1-60 MIN  COMPARISON:  Preoperative radiographs 09/28/2013  FINDINGS: A total of 4 intraoperative spot radiographs demonstrate open reduction and internal fixation of the previously described comminuted intertrochanteric fracture with and Affixus hip fracture nail system. Alignment of the fracture fragments is improved compared to the preoperative radiographs. No evidence of immediate hardware complication.  IMPRESSION: ORIF of comminuted intertrochanteric fracture without evidence of immediate hardware complication.  Alignment of the fracture fragments is improved compared to the preoperative radiographs.   Electronically Signed   By: Malachy Moan M.D.   On: 09/29/2013 16:14    Medications:  Scheduled Meds: . aspirin EC  325 mg Oral BID  . insulin aspart  0-9 Units Subcutaneous TID WC  . sertraline  100 mg Oral QPM   Continuous Infusions: . sodium chloride 50 mL/hr at 09/28/13 1605   PRN Meds:.acetaminophen, acetaminophen, ALPRAZolam, HYDROcodone-acetaminophen, HYDROcodone-acetaminophen, HYDROmorphone (DILAUDID) injection, menthol-cetylpyridinium, metoCLOPramide (REGLAN) injection, metoCLOPramide, morphine injection, morphine injection, ondansetron (ZOFRAN) IV, ondansetron, oxyCODONE, phenol  Assessment/Plan: Active Problems:  Closed left hip fracture   Hypertension   Diabetes mellitus postop  anemia: improved  stable  LOS: 3 days   Keidy Thurgood L 10/01/2013, 11:05 AM

## 2013-10-01 NOTE — Progress Notes (Addendum)
Clinical Social Work Department CLINICAL SOCIAL WORK PLACEMENT NOTE 10/01/2013  Patient:  [name], [name]  Account Number:  [id number] Admit date:  09/28/2013  Clinical Social Worker:  Jetta Lout, [place]  Date/time:  10/01/2013 05:42 PM  Clinical Social Work is seeking post-discharge placement for this patient at the following level of care:   SKILLED NURSING   (*CSW will update this form in Epic as items are completed)   10/01/2013  Patient/family provided with [facility] Department of Clinical Social Work's list of facilities offering this level of care within the geographic area requested by the patient (or if unable, by the patient's family).  10/01/2013  Patient/family informed of their freedom to choose among providers that offer the needed level of care, that participate in Medicare, Medicaid or managed care program needed by the patient, have an available bed and are willing to accept the patient.  10/01/2013  Patient/family informed of [facility]' ownership interest in [facility], as well as of the fact that they are under no obligation to receive care at this facility.  PASARR submitted to EDS on 09/16/2011 PASARR number received from [employer] on 09/17/2011  FL2 transmitted to all facilities in geographic area requested by pt/family on  10/01/2013 FL2 transmitted to all facilities within larger geographic area on   Patient informed that his/her managed care company has contracts with or will negotiate with  certain facilities, including the following:     Patient/family informed of bed offers received:  10/03/2013 Patient chooses bed at [facility] Physician recommends and patient chooses bed at    Patient to be transferred to  on  10/03/2013 Patient to be transferred to facility by [facility]  The following physician request were entered in Epic:   Additional Comments: Patient had an existing PASSAR.

## 2013-10-02 DIAGNOSIS — S72009D Fracture of unspecified part of neck of unspecified femur, subsequent encounter for closed fracture with routine healing: Secondary | ICD-10-CM

## 2013-10-02 LAB — CBC
MCH: 27.1 pg (ref 26.0–34.0)
MCHC: 32.4 g/dL (ref 30.0–36.0)
MCV: 83.8 fL (ref 78.0–100.0)
Platelets: 272 10*3/uL (ref 150–400)
RBC: 3.76 MIL/uL — ABNORMAL LOW (ref 3.87–5.11)
RDW: 14.8 % (ref 11.5–15.5)

## 2013-10-02 LAB — GLUCOSE, CAPILLARY
Glucose-Capillary: 112 mg/dL — ABNORMAL HIGH (ref 70–99)
Glucose-Capillary: 144 mg/dL — ABNORMAL HIGH (ref 70–99)
Glucose-Capillary: 122 mg/dL — ABNORMAL HIGH (ref 70–99)

## 2013-10-02 NOTE — Progress Notes (Signed)
   Subjective: No new complaints  Objective: Vital signs in last 24 hours: Temp:  [98.2 F (36.8 C)-99.1 F (37.3 C)] 99.1 F (37.3 C) (11/02 0657) Pulse Rate:  [78-82] 80 (11/02 0657) Resp:  [18-20] 18 (11/02 0657) BP: (139-153)/(66-76) 149/76 mmHg (11/02 0657) SpO2:  [96 %-100 %] 96 % (11/02 0657) Weight change:  Last BM Date: 09/28/13  Intake/Output from previous day: 11/01 0701 - 11/02 0700 In: 960 [P.O.:960] Out: 200 [Urine:200] Intake/Output this shift: Total I/O In: 560 [P.O.:560] Out: -   Gen: alert and oriented. In chair Lungs CTA without WRR CV RRR without MGR Abd S, NT, ND Ext no CCE  Lab Results:  Recent Labs  10/01/13 0353 10/02/13 0450  WBC 9.3 7.6  HGB 9.4* 10.2*  HCT 28.9* 31.5*  PLT 203 272   BMET  Recent Labs  09/30/13 0538 10/01/13 0353  NA 141 143  K 4.8 4.6  CL 106 110  CO2 27 27  GLUCOSE 126* 124*  BUN 11 11  CREATININE 0.83 0.78  CALCIUM 9.3 9.6    Studies/Results: No results found.  Medications:  Scheduled Meds: . aspirin EC  325 mg Oral BID  . insulin aspart  0-9 Units Subcutaneous TID WC  . senna-docusate  2 tablet Oral Daily  . sertraline  100 mg Oral QPM   Continuous Infusions:   PRN Meds:.acetaminophen, acetaminophen, ALPRAZolam, HYDROcodone-acetaminophen, HYDROmorphone (DILAUDID) injection, menthol-cetylpyridinium, metoCLOPramide (REGLAN) injection, metoCLOPramide, ondansetron (ZOFRAN) IV, ondansetron, oxyCODONE, phenol  Assessment/Plan: Active Problems:   Closed left hip fracture   Hypertension   Diabetes mellitus  OT rec CIR. PT rec SNF.  Will screen for CIR   LOS: 4 days   Sherrell Weir L 10/02/2013, 1:03 PM

## 2013-10-02 NOTE — Evaluation (Signed)
Occupational Therapy Evaluation Patient Details Name: [name] MRN: [id number] DOB: [birth year] Today's Date: 10/02/2013 Time: 8295-6213 OT Time Calculation (min): 14 min  OT Assessment / Plan / Recommendation History of present illness Pt is a 62 y/o female admitted s/p intertrochanteric IM nailing.   Clinical Impression   [name] presents to acute OT with deficits below. She was independent and living by herself PTA and would best benefit from inpatient rehab to get her back to an Independent/Mod I in the shortest amount of time.    OT Assessment  Patient needs continued OT Services    Follow Up Recommendations  CIR    Barriers to Discharge Decreased caregiver support    Equipment Recommendations   (TBD next venue)       Frequency  Min 2X/week    Precautions / Restrictions Precautions Precautions: Fall Restrictions LLE Weight Bearing: Weight bearing as tolerated   Pertinent Vitals/Pain 3/10 LLE; repositioned    ADL  Eating/Feeding: Independent Where Assessed - Eating/Feeding: Chair Grooming: Set up Where Assessed - Grooming: Unsupported sitting Upper Body Bathing: Set up Where Assessed - Upper Body Bathing: Unsupported sitting Lower Body Bathing: Minimal assistance Where Assessed - Lower Body Bathing: Supported sit to stand Upper Body Dressing: Set up Where Assessed - Upper Body Dressing: Unsupported sitting Lower Body Dressing: Maximal assistance Where Assessed - Lower Body Dressing: Supported sit to stand Toilet Transfer: Minimal assistance Toilet Transfer Method: Surveyor, minerals: Materials engineer and Hygiene: Minimal assistance Where Assessed - Engineer, mining and Hygiene: Sit to stand from 3-in-1 or toilet Equipment Used: Rolling walker Transfers/Ambulation Related to ADLs: Min A stand pivot from 3n1 to recliner 180 degrees    OT Diagnosis:  Generalized weakness;Acute pain  OT Problem List: Decreased strength;Decreased activity tolerance;Impaired balance (sitting and/or standing);Decreased knowledge of use of DME or AE;Pain OT Treatment Interventions: Self-care/ADL training;Balance training;DME and/or AE instruction;Patient/family education   OT Goals(Current goals can be found in the care plan section) Acute Rehab OT Goals Patient Stated Goal: to go to rehab then home OT Goal Formulation: With patient Time For Goal Achievement: 10/09/13 Potential to Achieve Goals: Good  Visit Information  Last OT Received On: 10/02/13 Assistance Needed: +1 History of Present Illness: Pt is a 62 y/o female admitted s/p intertrochanteric IM nailing.       Prior Functioning     Home Living Family/patient expects to be discharged to:: Inpatient rehab Prior Function Level of Independence: Independent Communication Communication: No difficulties Dominant Hand: Right         Vision/Perception Vision - History Patient Visual Report: No change from baseline   Cognition  Cognition Arousal/Alertness: Awake/alert Behavior During Therapy: WFL for tasks assessed/performed Overall Cognitive Status: Within Functional Limits for tasks assessed    Extremity/Trunk Assessment Upper Extremity Assessment Upper Extremity Assessment: Overall WFL for tasks assessed     Mobility Bed Mobility Details for Bed Mobility Assistance: Pt up on 3n1 upon arrival working on getting bathed Transfers Transfers: Sit to Stand;Stand to Sit Sit to Stand: 4: Min assist;With upper extremity assist;With armrests;From chair/3-in-1 Stand to Sit: 4: Min assist;With upper extremity assist;With armrests;To chair/3-in-1 Details for Transfer Assistance: VCs for safe hand placement           End of Session OT - End of Session Equipment Utilized During Treatment: Rolling walker Activity Tolerance: Patient limited by fatigue Patient left: in chair;with call  bell/phone within reach  Evette Georges [phone] 10/02/2013, 9:43 AM

## 2013-10-02 NOTE — Progress Notes (Signed)
PATIENT ID: [name]  MRN: [id number]  DOB/AGE:  [birth year] / 64 y.o.  3 Days Post-Op Procedure(s) (LRB): INTRAMEDULLARY (IM) NAIL INTERTROCHANTRIC (Left)    PROGRESS NOTE Subjective: Patient is alert, oriented,no Nausea, no Vomiting, yes passing gas, no Bowel Movement. Taking PO well, pt up and eating in room. Denies SOB, Chest or Calf Pain. Using Incentive Spirometer, PAS in place. Ambulate WBAT Patient reports pain as 6 on 0-10 scale  .    Objective: Vital signs in last 24 hours: Filed Vitals:   10/01/13 0857 10/01/13 1500 10/01/13 2202 10/02/13 0657  BP: 110/68 153/66 139/70 149/76  Pulse:  78 82 80  Temp:  98.2 F (36.8 C) 98.9 F (37.2 C) 99.1 F (37.3 C)  TempSrc:  Oral Oral Oral  Resp: 16 20 18 18   Height:      Weight:      SpO2:  100% 98% 96%      Intake/Output from previous day: I/O last 3 completed shifts: In: 1430 [P.O.:1080; Blood:350] Out: 200 [Urine:200]   Intake/Output this shift:     LABORATORY DATA:  Recent Labs  09/30/13 0538  10/01/13 0353  10/01/13 1647 10/01/13 2205 10/02/13 0450 10/02/13 0637  WBC 10.6*  --  9.3  --   --   --  7.6  --   HGB 7.7*  --  9.4*  --   --   --  10.2*  --   HCT 23.7*  --  28.9*  --   --   --  31.5*  --   PLT 251  --  203  --   --   --  272  --   NA 141  --  143  --   --   --   --   --   K 4.8  --  4.6  --   --   --   --   --   CL 106  --  110  --   --   --   --   --   CO2 27  --  27  --   --   --   --   --   BUN 11  --  11  --   --   --   --   --   CREATININE 0.83  --  0.78  --   --   --   --   --   GLUCOSE 126*  --  124*  --   --   --   --   --   GLUCAP  --   < >  --   < > 130* 157*  --  109*  CALCIUM 9.3  --  9.6  --   --   --   --   --   < > = values in this interval not displayed.  Examination: Neurologically intact ABD soft Neurovascular intact Sensation intact distally Intact pulses distally Dorsiflexion/Plantar flexion intact Incision: no drainage No cellulitis present Compartment  soft} XR AP&Lat of hip shows well placed\fixed THA  Assessment:   3 Days Post-Op Procedure(s) (LRB): INTRAMEDULLARY (IM) NAIL INTERTROCHANTRIC (Left) ADDITIONAL DIAGNOSIS:  Post op anemia -  improved after transfusion 2 days ago.  Plan: PT/OT WBAT  DVT Prophylaxis: SCDx72 hrs, ASA 325 mg BID x 2 weeks  DISCHARGE PLAN: Skilled Nursing Facility/Rehab when bed available  DISCHARGE NEEDS: HHPT, HHRN, Walker and 3-in-1 comode seat  Pt is follow by internal medicine

## 2013-10-03 DIAGNOSIS — S72009A Fracture of unspecified part of neck of unspecified femur, initial encounter for closed fracture: Secondary | ICD-10-CM

## 2013-10-03 DIAGNOSIS — D62 Acute posthemorrhagic anemia: Secondary | ICD-10-CM

## 2013-10-03 LAB — GLUCOSE, CAPILLARY
Glucose-Capillary: 106 mg/dL — ABNORMAL HIGH (ref 70–99)
Glucose-Capillary: 158 mg/dL — ABNORMAL HIGH (ref 70–99)
Glucose-Capillary: 105 mg/dL — ABNORMAL HIGH (ref 70–99)

## 2013-10-03 MED ORDER — ONDANSETRON HCL 4 MG PO TABS: 4.0000 mg | ORAL_TABLET | Freq: Four times a day (QID) | ORAL | Status: DC | PRN

## 2013-10-03 MED ORDER — BISACODYL 10 MG RE SUPP: 10.0000 mg | Freq: Every day | RECTAL | Status: DC | PRN

## 2013-10-03 MED ORDER — ALPRAZOLAM 0.5 MG PO TABS: 0.5000 mg | ORAL_TABLET | Freq: Every evening | ORAL | Status: DC | PRN

## 2013-10-03 MED ORDER — POLYETHYLENE GLYCOL 3350 17 GM/SCOOP PO POWD: 17.0000 g | Freq: Every day | ORAL | Status: DC

## 2013-10-03 MED ORDER — ACETAMINOPHEN 325 MG PO TABS: 650.0000 mg | ORAL_TABLET | Freq: Four times a day (QID) | ORAL | Status: DC | PRN

## 2013-10-03 MED ORDER — BISACODYL 10 MG RE SUPP
10.0000 mg | Freq: Once | RECTAL | Status: AC
  Administered 2013-10-03: 10 mg via RECTAL
  Filled 2013-10-03: qty 1

## 2013-10-03 NOTE — Progress Notes (Signed)
Clinical social worker assisted with patient discharge to skilled nursing facility, [facility].  CSW addressed all family questions and concerns. CSW copied chart and added all important documents. CSW also set up patient transportation with [facility]. Clinical Social Worker will sign off for now as social work intervention is no longer needed.   Roper Tolson, MSW, LCSWA [phone] 

## 2013-10-03 NOTE — Discharge Summary (Signed)
Physician Discharge Summary  REYES FIFIELD AVW:[id number] DOB: 01-24-1951 DOA: 09/28/2013  PCP: Nadean Corwin, MD  Admit date: 09/28/2013 Discharge date: 10/03/2013  Time spent: greater than 30 minutes  Recommendations for Outpatient Follow-up:  1. Monitor h/h  Discharge Diagnoses:  Active Problems:   Closed left hip fracture   Hypertension   Diabetes mellitus postoperative anemia  Discharge Condition: stable  Filed Weights   09/29/13 0354  Weight: 77.111 kg (170 lb)    History of present illness:  63 y.o. female with h/o hypertension, DM, had a mechanical fall this afternoon. She was brought to ED. She complained of left hip pain, imaging showed comminuted left hip fracture. Orthopedics were consulted by EDP. She will be admitted to medical service for further evaluation and management.   Hospital Course:  Patient was admitted to the medical service. Orthopedics consulted. Patient had left IM nail by Dr. Ave Filter without complications. Postoperatively she did have anemia but no significant ongoing bleeding. She was transfused a unit of packed red blood cells. She has worked with physical therapy and has been stable. Physical therapy is recommending skilled nursing facility. Stable for discharge and cleared by orthopedics for discharge.  Procedures:  Left intramedullary nail, intertrochanteric by Dr. Ave Filter on 09/29/2013  Consultations:  Orthopedics  Discharge Exam: Filed Vitals:   10/03/13 0559  BP: 178/82  Pulse: 90  Temp: 99 F (37.2 C)  Resp: 18    General: in chair Cardiovascular: RRR without MGR Respiratory: CTA without WRR Ext trace left edema  Discharge Instructions  Discharge Orders   Future Orders Complete By Expires   Diet - low sodium heart healthy  As directed    Weight bearing as tolerated  As directed    Questions:     Laterality:     Extremity:     Weight bearing as tolerated  As directed    Questions:     Laterality:      Extremity:         Medication List    STOP taking these medications       aspirin 81 MG chewable tablet  Replaced by:  aspirin EC 325 MG tablet      TAKE these medications       acetaminophen 325 MG tablet  Commonly known as:  TYLENOL  Take 2 tablets (650 mg total) by mouth every 6 (six) hours as needed.     ALPRAZolam 0.5 MG tablet  Commonly known as:  XANAX  Take 1 tablet (0.5 mg total) by mouth at bedtime as needed for sleep or anxiety.     aspirin EC 325 MG tablet  Take 1 tablet (325 mg total) by mouth 2 (two) times daily.     bisacodyl 10 MG suppository  Commonly known as:  DULCOLAX  Place 1 suppository (10 mg total) rectally daily as needed for constipation.     cholecalciferol 400 UNITS Tabs tablet  Commonly known as:  VITAMIN D  Take 400 Units by mouth daily.     CRESTOR 20 MG tablet  Generic drug:  rosuvastatin  Take 20 mg by mouth every evening.     HYDROcodone-acetaminophen 5-325 MG per tablet  Commonly known as:  NORCO  Take 1-2 tablets by mouth every 4 (four) hours as needed for pain.     metFORMIN 500 MG 24 hr tablet  Commonly known as:  GLUCOPHAGE-XR  Take 1,000 mg by mouth 2 (two) times daily.     ondansetron 4 MG tablet  Commonly  known as:  ZOFRAN  Take 1 tablet (4 mg total) by mouth every 6 (six) hours as needed for nausea.     polyethylene glycol powder powder  Commonly known as:  GLYCOLAX/MIRALAX  Take 17 g by mouth daily.     quinapril-hydrochlorothiazide 20-12.5 MG per tablet  Commonly known as:  ACCURETIC  Take 1 tablet by mouth every evening.     sertraline 100 MG tablet  Commonly known as:  ZOLOFT  Take 100 mg by mouth every evening.       Allergies  Allergen Reactions  . Other Other (See Comments)    Unknown anesthetic; unknown reaction; Anesthetic was given during back surgery       Follow-up Information   Follow up with Mable Paris, MD. Schedule an appointment as soon as possible for a visit in 1 week.    Specialty:  Orthopedic Surgery   Contact information:   [address] [place] [address] [phone]       Follow up In 2 weeks.      Follow up with Mable Paris, MD. Schedule an appointment as soon as possible for a visit in 2 weeks.   Specialty:  Orthopedic Surgery   Contact information:   [address] [place] [address] [phone]        The results of significant diagnostics from this hospitalization (including imaging, microbiology, ancillary and laboratory) are listed below for reference.    Significant Diagnostic Studies: Dg Chest 1 View  09/28/2013   CLINICAL DATA:  Fall, cough, congestion  EXAM: CHEST - 1 VIEW  COMPARISON:  09/09/2011  FINDINGS: Normal heart size for technique. No focal pneumonia, collapse or consolidation. No effusion or pneumothorax. Trachea midline. Degenerative changes of the spine.  IMPRESSION: No acute chest findings.   Electronically Signed   By: Ruel Favors M.D.   On: 09/28/2013 17:08   Dg Hip Complete Left  09/28/2013   CLINICAL DATA:  Left hip pain post fall.  EXAM: LEFT HIP - COMPLETE 2+ VIEW  COMPARISON:  11/09/2009  FINDINGS: Comminuted left intertrochanteric and proximal diaphyseal femur fracture, with impaction and override of major fracture fragments. No dislocation or intra-articular involvement of the fracture. Changes of PLIF L4-5.  IMPRESSION: 1. Comminuted left intertrochanteric and proximal diaphyseal femur fracture   Electronically Signed   By: Oley Balm M.D.   On: 09/28/2013 17:13   Dg Femur Left  09/29/2013   CLINICAL DATA:  ORIF left intertrochanteric fracture  EXAM: LEFT FEMUR - 2 VIEW; DG C-ARM 1-60 MIN  COMPARISON:  Preoperative radiographs 09/28/2013  FINDINGS: A total of 4 intraoperative spot radiographs demonstrate open reduction and internal fixation of the previously described comminuted intertrochanteric fracture with and Affixus hip fracture nail system. Alignment of  the fracture fragments is improved compared to the preoperative radiographs. No evidence of immediate hardware complication.  IMPRESSION: ORIF of comminuted intertrochanteric fracture without evidence of immediate hardware complication.  Alignment of the fracture fragments is improved compared to the preoperative radiographs.   Electronically Signed   By: Malachy Moan M.D.   On: 09/29/2013 16:14   Dg Femur Left Port  09/29/2013   CLINICAL DATA:  Left intramedullary nail placement.  EXAM: PORTABLE LEFT FEMUR - 2 VIEW  COMPARISON:  09/28/2013  FINDINGS: Left femoral intramedullary nail observe traversing the mildly comminuted intertrochanteric fracture. Separate lesser trochanteric fragment noted. Near-anatomic alignment and positioning of the dominant fracture fragments compared to preoperative images. Single distal interlocking screw noted  along the nail.  IMPRESSION: 1. Left intramedullary nail placement, traversing the mildly comminuted intertrochanteric fracture. No complicating feature.   Electronically Signed   By: Herbie Baltimore M.D.   On: 09/29/2013 15:19   Dg C-arm 1-60 Min  09/29/2013   CLINICAL DATA:  ORIF left intertrochanteric fracture  EXAM: LEFT FEMUR - 2 VIEW; DG C-ARM 1-60 MIN  COMPARISON:  Preoperative radiographs 09/28/2013  FINDINGS: A total of 4 intraoperative spot radiographs demonstrate open reduction and internal fixation of the previously described comminuted intertrochanteric fracture with and Affixus hip fracture nail system. Alignment of the fracture fragments is improved compared to the preoperative radiographs. No evidence of immediate hardware complication.  IMPRESSION: ORIF of comminuted intertrochanteric fracture without evidence of immediate hardware complication.  Alignment of the fracture fragments is improved compared to the preoperative radiographs.   Electronically Signed   By: Malachy Moan M.D.   On: 09/29/2013 16:14    Microbiology: Recent Results (from  the past 240 hour(s))  SURGICAL PCR SCREEN     Status: None   Collection Time    09/29/13  6:04 AM      Result Value Range Status   MRSA, PCR NEGATIVE  NEGATIVE Final   Staphylococcus aureus NEGATIVE  NEGATIVE Final   Comment:            The Xpert SA Assay (FDA     approved for NASAL specimens     in patients over 63 years of age),     is one component of     a comprehensive surveillance     program.  Test performance has     been validated by [employer] for patients greater     than or equal to 33 year old.     It is not intended     to diagnose infection nor to     guide or monitor treatment.     Labs: Basic Metabolic Panel:  Recent Labs Lab 09/28/13 1632 09/29/13 0510 09/30/13 0538 10/01/13 0353  NA 141 141 141 143  K 4.5 5.0 4.8 4.6  CL 102 106 106 110  CO2 26 26 27 27   GLUCOSE 114* 126* 126* 124*  BUN 18 20 11 11   CREATININE 0.88 0.78 0.83 0.78  CALCIUM 10.8* 10.0 9.3 9.6   Liver Function Tests: No results found for this basename: AST, ALT, ALKPHOS, BILITOT, PROT, ALBUMIN,  in the last 168 hours No results found for this basename: LIPASE, AMYLASE,  in the last 168 hours No results found for this basename: AMMONIA,  in the last 168 hours CBC:  Recent Labs Lab 09/28/13 1632 09/29/13 0510 09/30/13 0538 10/01/13 0353 10/02/13 0450  WBC 12.9* 16.4* 10.6* 9.3 7.6  NEUTROABS 11.6*  --   --   --   --   HGB 10.3* 9.3* 7.7* 9.4* 10.2*  HCT 32.5* 29.2* 23.7* 28.9* 31.5*  MCV 83.5 83.2 83.7 84.3 83.8  PLT 350 322 251 203 272   Cardiac Enzymes: No results found for this basename: CKTOTAL, CKMB, CKMBINDEX, TROPONINI,  in the last 168 hours BNP: BNP (last 3 results) No results found for this basename: PROBNP,  in the last 8760 hours CBG:  Recent Labs Lab 10/02/13 1123 10/02/13 1631 10/02/13 2147 10/03/13 0613 10/03/13 0837  GLUCAP 112* 144* 122* 106* 158*   Signed:  Miklo Aken L  [facility] 10/03/2013, 11:20 AM

## 2013-10-03 NOTE — Progress Notes (Signed)
Rehab Admissions Coordinator Note:  Patient was screened by Trish Mage for appropriateness for an Inpatient Acute Rehab Consult. Noted PT recommending SNF and OT recommending CIR.  Patient has [employer].  It is highly unlikely that Susa Simmonds will authorize an acute inpatient rehab stay given the current diagnosis.   At this time, we are recommending Skilled Nursing Facility.  Call me for questions.  Trish Mage 10/03/2013, 8:23 AM  I can be reached at [phone].

## 2013-10-03 NOTE — Progress Notes (Signed)
PT Cancellation Note  Patient Details Name: [name] MRN: [id number] DOB: 1951/01/05   Cancelled Treatment:     Pt declining therapy at this time stating she is leaving today & she wants to finish lunch.  Will attempt back later today if time allows.      Verdell Face, [place] [phone] 10/03/2013

## 2013-10-03 NOTE — Progress Notes (Signed)
PATIENT ID: [name]   4 Days Post-Op Procedure(s) (LRB): INTRAMEDULLARY (IM) NAIL INTERTROCHANTRIC (Left)  Subjective: Feeling well this am. Shared that she feels apprehensive putting weight on the left lower extremity but has been tolerating PT well. Reports L hip pain with mvmt and use.   Objective:  Filed Vitals:   10/03/13 0559  BP: 178/82  Pulse: 90  Temp: 99 F (37.2 C)  Resp: 18     Awake, alert, orientated, sitting up in chair Dressing c/d/i, incisions healing well and benign No ecchymosis, swelling, warmth Wiggles toes, distally NVI  Labs:   Recent Labs  10/01/13 0353 10/02/13 0450  HGB 9.4* 10.2*   Recent Labs  10/01/13 0353 10/02/13 0450  WBC 9.3 7.6  RBC 3.43* 3.76*  HCT 28.9* 31.5*  PLT 203 272   Recent Labs  10/01/13 0353  NA 143  K 4.6  CL 110  CO2 27  BUN 11  CREATININE 0.78  GLUCOSE 124*  CALCIUM 9.6    Assessment and Plan: 4 days po Working well with PT OT rec [facility], PT rec SNF,from rehab note it sounds she will not be approved for CIR Likely d/c to SNF today, discharge per primary team ABLA- improved from transfusion, asymptomatic norco for pain control, ASA 325mg  BIDx3 weeks for outpatient DVT prophylaxis, scripts in chart WBAT  VTE proph: ASA 325mg  BIDx3 weeks

## 2013-10-05 ENCOUNTER — Non-Acute Institutional Stay (SKILLED_NURSING_FACILITY): Payer: Medicare Other | Admitting: Internal Medicine

## 2013-10-05 DIAGNOSIS — D62 Acute posthemorrhagic anemia: Secondary | ICD-10-CM

## 2013-10-05 DIAGNOSIS — S72002S Fracture of unspecified part of neck of left femur, sequela: Secondary | ICD-10-CM

## 2013-10-05 DIAGNOSIS — I10 Essential (primary) hypertension: Secondary | ICD-10-CM

## 2013-10-05 DIAGNOSIS — S72009S Fracture of unspecified part of neck of unspecified femur, sequela: Secondary | ICD-10-CM

## 2013-10-05 DIAGNOSIS — E119 Type 2 diabetes mellitus without complications: Secondary | ICD-10-CM

## 2013-10-13 ENCOUNTER — Non-Acute Institutional Stay (SKILLED_NURSING_FACILITY): Payer: Medicare Other | Admitting: Adult Health

## 2013-10-13 DIAGNOSIS — N390 Urinary tract infection, site not specified: Secondary | ICD-10-CM

## 2013-10-24 ENCOUNTER — Other Ambulatory Visit: Payer: Self-pay

## 2013-10-24 MED ORDER — HYDROCODONE-ACETAMINOPHEN 5-325 MG PO TABS: 1.0000 | ORAL_TABLET | ORAL | Status: DC | PRN

## 2013-10-24 NOTE — Progress Notes (Signed)
Patient ID: [name], female   DOB: 1951-09-08, 62 y.o.   MRN: [id number]       PROGRESS NOTE  DATE: 10/13/2013  FACILITY:   [facility]  LEVEL OF CARE:   SNF (13)  Acute Visit  CHIEF COMPLAINT:  Manage UTI  HISTORY OF PRESENT ILLNESS: This is a 62 year old female who complains of increasing urinary frequency. Urine culture shows >= 100,000 colonies/ml of Klebsiella Pneumoniae. No fever nor hematuria noted.  PAST MEDICAL HISTORY : Reviewed.  No changes.  CURRENT MEDICATIONS: Reviewed per [facility]  REVIEW OF SYSTEMS:  GENERAL: no change in appetite, no fatigue, no weight changes, no fever, chills or weakness RESPIRATORY: no cough, SOB, DOE,, wheezing, hemoptysis CARDIAC: no chest pain, edema or palpitations GI: no abdominal pain, diarrhea, constipation, heart burn, nausea or vomiting  PHYSICAL EXAMINATION  VS:  T98.4        P78       RR18       BP129/76      POX96 %       WT174.6 (Lb)  GENERAL: no acute distress, normal body habitus EYES: conjunctivae normal, sclerae normal, normal eye lids NECK: supple, trachea midline, no neck masses, no thyroid tenderness, no thyromegaly LYMPHATICS: no LAN in the neck, no supraclavicular LAN RESPIRATORY: breathing is even & unlabored, BS CTAB CARDIAC: RRR, no murmur,no extra heart sounds, no edema GI: abdomen soft, normal BS, no masses, no tenderness, no hepatomegaly, no splenomegaly PSYCHIATRIC: the patient is alert & oriented to person, affect & behavior appropriate  LABS/RADIOLOGY: 10/04/13 sodium 140 potassium 3.7 glucose 127 BUN 20 creatinine 0.7 calcium 9.6 TSH 2.1920 WBC 5.9 hemoglobin 9.6 hematocrit 32.1 hemoglobin A1c 6.3  ASSESSMENT/PLAN:  UTI - start Bactrim DS 1 tab PO BID x 7 days  CPT CODE: 46962

## 2013-10-26 ENCOUNTER — Other Ambulatory Visit: Payer: Self-pay

## 2013-10-26 MED ORDER — HYDROCODONE-ACETAMINOPHEN 5-325 MG PO TABS: ORAL_TABLET | ORAL | Status: DC

## 2013-11-08 ENCOUNTER — Non-Acute Institutional Stay (SKILLED_NURSING_FACILITY): Payer: Medicare Other | Admitting: Adult Health

## 2013-11-08 DIAGNOSIS — S72002D Fracture of unspecified part of neck of left femur, subsequent encounter for closed fracture with routine healing: Secondary | ICD-10-CM

## 2013-11-08 DIAGNOSIS — K5900 Constipation, unspecified: Secondary | ICD-10-CM

## 2013-11-08 DIAGNOSIS — E785 Hyperlipidemia, unspecified: Secondary | ICD-10-CM

## 2013-11-08 DIAGNOSIS — S72009D Fracture of unspecified part of neck of unspecified femur, subsequent encounter for closed fracture with routine healing: Secondary | ICD-10-CM

## 2013-11-08 DIAGNOSIS — I10 Essential (primary) hypertension: Secondary | ICD-10-CM

## 2013-11-08 DIAGNOSIS — E119 Type 2 diabetes mellitus without complications: Secondary | ICD-10-CM

## 2013-11-08 DIAGNOSIS — F329 Major depressive disorder, single episode, unspecified: Secondary | ICD-10-CM

## 2013-11-10 ENCOUNTER — Other Ambulatory Visit: Payer: Self-pay | Admitting: Internal Medicine

## 2013-11-11 NOTE — Progress Notes (Signed)
Patient ID: [name], female   DOB: 19-Jan-1951, 62 y.o.   MRN: [id number]        HISTORY & PHYSICAL  DATE: 10/05/2013   FACILITY: [facility]  LEVEL OF CARE: SNF (31)  ALLERGIES:  Allergies  Allergen Reactions  . Other Other (See Comments)    Unknown anesthetic; unknown reaction; Anesthetic was given during back surgery    CHIEF COMPLAINT:  Manage left hip fracture, acute blood loss anemia, and diabetes mellitus.     HISTORY OF PRESENT ILLNESS:  The patient is a 62 year-old, Caucasian female.    HIP FRACTURE: The patient had a mechanical fall and sustained a femur fracture.  Patient subsequently underwent surgical repair and tolerated the procedure well. Patient is admitted to this facility for short-term rehabilitation. Patient denies hip pain currently. No complications reported from the pain medications currently being used.    ANEMIA:  Postoperatively, patient suffered acute blood loss and she was transfused 1 U of packed red blood cells.  The anemia has been stable. The patient denies fatigue, melena or hematochezia.  The patient is currently not on iron.  Last hemoglobins are:   10.2, 9.4, 7.7.    DM:pt's DM remains stable.  Pt denies polyuria, polydipsia, polyphagia, changes in vision or hypoglycemic episodes.  No complications noted from the medication presently being used.  Last hemoglobin A1c is:   Not available.    PAST MEDICAL HISTORY :  Past Medical History  Diagnosis Date  . Hypertension   . Diabetes mellitus without complication   . Hyperlipidemia   . Fibromyalgia   . Back pain     PAST SURGICAL HISTORY: Past Surgical History  Procedure Laterality Date  . Intramedullary (im) nail intertrochanteric Left 09/29/2013    Procedure: INTRAMEDULLARY (IM) NAIL INTERTROCHANTRIC;  Surgeon: Mable Paris, MD;  Location: [facility] OR;  Service: Orthopedics;  Laterality: Left;    SOCIAL HISTORY:  reports that she has never smoked. She does not  have any smokeless tobacco history on file. She reports that she does not drink alcohol.  FAMILY HISTORY: None  CURRENT MEDICATIONS: Reviewed per MAR  REVIEW OF SYSTEMS:  See HPI otherwise 14 point ROS is negative.  PHYSICAL EXAMINATION  VS:  T 97.8       P 80      RR 18      BP 146/82      POX 95%        WT (Lb)  GENERAL: no acute distress, moderately obese body habitus EYES: conjunctivae normal, sclerae normal, normal eye lids MOUTH/THROAT: lips without lesions,no lesions in the mouth,tongue is without lesions,uvula elevates in midline NECK: supple, trachea midline, no neck masses, no thyroid tenderness, no thyromegaly LYMPHATICS: no LAN in the neck, no supraclavicular LAN RESPIRATORY: breathing is even & unlabored, BS CTAB CARDIAC: RRR, no murmur,no extra heart sounds   EDEMA/VARICOSITIES:  +1 bilateral lower extremity edema  ARTERIAL:  pedal pulses +1 bilaterally   GI:  ABDOMEN: abdomen soft, normal BS, no masses, no tenderness  LIVER/SPLEEN: no hepatomegaly, no splenomegaly MUSCULOSKELETAL: HEAD: normal to inspection & palpation BACK: no kyphosis, scoliosis or spinal processes tenderness EXTREMITIES: LEFT UPPER EXTREMITY: strength intact, range of motion moderate   RIGHT UPPER EXTREMITY: strength intact, range of motion moderate   LEFT LOWER EXTREMITY: strength intact, range of motion not tested due to surgery   RIGHT LOWER EXTREMITY: strength intact, range of motion moderate   PSYCHIATRIC: the patient is alert & oriented  to person, affect & behavior appropriate  LABS/RADIOLOGY: Chest x-ray:  No acute disease.    Left hip x-ray:  Showed comminuted left intertrochanteric and proximal diaphyseal femur fracture.    Left femur x-ray postsurgically:  Showed ORIF of fracture.    MRSA by PCR negative.     Staph aureus by PCR negative.    Labs reviewed: Basic Metabolic Panel:  Recent Labs  78/29/56 0510 09/30/13 0538 10/01/13 0353  NA 141 141 143  K 5.0 4.8 4.6  CL  106 106 110  CO2 26 27 27   GLUCOSE 126* 126* 124*  BUN 20 11 11   CREATININE 0.78 0.83 0.78  CALCIUM 10.0 9.3 9.6   CBC:  Recent Labs  09/28/13 1632  09/30/13 0538 10/01/13 0353 10/02/13 0450  WBC 12.9*  < > 10.6* 9.3 7.6  NEUTROABS 11.6*  --   --   --   --   HGB 10.3*  < > 7.7* 9.4* 10.2*  HCT 32.5*  < > 23.7* 28.9* 31.5*  MCV 83.5  < > 83.7 84.3 83.8  PLT 350  < > 251 203 272  < > = values in this interval not displayed.   Recent Labs  10/03/13 0613 10/03/13 0837 10/03/13 1118  GLUCAP 106* 158* 105*    ASSESSMENT/PLAN:  Left hip fracture.  Status post IM nail.    Acute blood loss anemia.  Status post transfusion.   Reassess hemoglobin level.    Diabetes mellitus.  Continue current medications.  Check hemoglobin A1c.    Hypertension.  Blood pressure borderline.  We will monitor.    Constipation.  Well controlled.    Check CBC with diff and BMP.    I have reviewed patient's medical records received at admission/from hospitalization.  CPT CODE: 21308

## 2013-11-12 DIAGNOSIS — F329 Major depressive disorder, single episode, unspecified: Secondary | ICD-10-CM

## 2013-11-12 DIAGNOSIS — E119 Type 2 diabetes mellitus without complications: Secondary | ICD-10-CM

## 2013-11-12 DIAGNOSIS — R269 Unspecified abnormalities of gait and mobility: Secondary | ICD-10-CM

## 2013-11-12 DIAGNOSIS — S72009D Fracture of unspecified part of neck of unspecified femur, subsequent encounter for closed fracture with routine healing: Secondary | ICD-10-CM

## 2013-11-16 DIAGNOSIS — F324 Major depressive disorder, single episode, in partial remission: Secondary | ICD-10-CM | POA: Insufficient documentation

## 2013-11-16 DIAGNOSIS — E785 Hyperlipidemia, unspecified: Secondary | ICD-10-CM | POA: Insufficient documentation

## 2013-11-16 NOTE — Progress Notes (Signed)
Patient ID: [name], female   DOB: 1951/06/11, 62 y.o.   MRN: [id number]              PROGRESS NOTE  DATE: 11/08/2013   FACILITY: [facility]  LEVEL OF CARE: SNF (31)  Acute Visit  CHIEF COMPLAINT:  Discharge Notes  HISTORY OF PRESENT ILLNESS: This is a 62 year old female who is for discharge home with Home health PT and OT. She has been admitted to [facility] on 10/03/13 from [facility]. She had a fall and sustained a left hip fracture. She had IM Nail done in the hospital. Patient was admitted to this facility for short-term rehabilitation after the patient's recent hospitalization.  Patient has completed SNF rehabilitation and therapy has cleared the patient for discharge.  Reassessment of ongoing problem(s):  HYPERLIPIDEMIA: No complications from the medications presently being used. 11/14 fasting lipid panel showed :cholesterol 137 HDL 36 LDL 74 triglycerides 136  DEPRESSION: The depression remains stable. Patient denies ongoing feelings of sadness, insomnia, anedhonia or lack of appetite. No complications reported from the medications currently being used. Staff do not report behavioral problems.  HTN: Pt 's HTN remains stable.  Denies CP, sob, DOE, pedal edema, headaches, dizziness or visual disturbances.  No complications from the medications currently being used.  Last BP :108/70  PAST MEDICAL HISTORY : Reviewed.  No changes.  CURRENT MEDICATIONS: Reviewed per [facility]  REVIEW OF SYSTEMS:  GENERAL: no change in appetite, no fatigue, no weight changes, no fever, chills or weakness RESPIRATORY: no cough, SOB, DOE, wheezing, hemoptysis CARDIAC: no chest pain, edema or palpitations GI: no abdominal pain, diarrhea, constipation, heart burn, nausea or vomiting  PHYSICAL EXAMINATION  VS:  T97.3       P76       RR22      BP108/70            WT170.6 (Lb)  GENERAL: no acute distress, normal body habitus EYES: conjunctivae normal, sclerae normal,  normal eye lids NECK: supple, trachea midline, no neck masses, no thyroid tenderness, no thyromegaly LYMPHATICS: no LAN in the neck, no supraclavicular LAN RESPIRATORY: breathing is even & unlabored, BS CTAB CARDIAC: RRR, no murmur,no extra heart sounds, no edema GI: abdomen soft, normal BS, no masses, no tenderness, no hepatomegaly, no splenomegaly PSYCHIATRIC: the patient is alert & oriented to person, affect & behavior appropriate  LABS/RADIOLOGY: 10/13/13 cholesterol 137 HDL 36 LDL 74 triglycerides 136 10/04/13 sodium 140 potassium 3.7 glucose 127 BUN 20 creatinine 0.7 calcium 9.6 TSH 2.1920  WBC 5.9 hemoglobin 9.6 hematocrit 32.1 hemoglobin A1c 6.3  ASSESSMENT/PLAN:  Left hip fracture status post IM nail -  For home health PT and OT   Hyperlipidemia -  Continue Lipitor   hypertension - well controlled; continue Zestoretic   Depression -  Stable; continue Zoloft  Diabetes mellitus, type II -  Well-controlled; continue metformin   I have filled out patient's discharge paperwork and written prescriptions.  Patient will receive home health PT and OT.  Total discharge time: Less than 30 minutes Discharge time involved coordination of the discharge process with Child psychotherapist, nursing staff and therapy department. Medical justification for home health services verified.  CPT CODE: 40981

## 2013-11-20 DIAGNOSIS — E1122 Type 2 diabetes mellitus with diabetic chronic kidney disease: Secondary | ICD-10-CM | POA: Insufficient documentation

## 2013-11-20 DIAGNOSIS — Z87442 Personal history of urinary calculi: Secondary | ICD-10-CM | POA: Insufficient documentation

## 2013-11-20 DIAGNOSIS — E559 Vitamin D deficiency, unspecified: Secondary | ICD-10-CM | POA: Insufficient documentation

## 2013-11-21 ENCOUNTER — Ambulatory Visit: Payer: Self-pay | Admitting: Physician Assistant

## 2013-12-10 ENCOUNTER — Other Ambulatory Visit: Payer: Self-pay | Admitting: Adult Health

## 2013-12-12 ENCOUNTER — Other Ambulatory Visit: Payer: Self-pay | Admitting: Internal Medicine

## 2013-12-23 ENCOUNTER — Other Ambulatory Visit: Payer: Self-pay | Admitting: Physician Assistant

## 2013-12-23 MED ORDER — ALPRAZOLAM 0.5 MG PO TABS: ORAL_TABLET | ORAL | Status: DC

## 2013-12-28 ENCOUNTER — Telehealth: Payer: Self-pay | Admitting: Internal Medicine

## 2013-12-28 NOTE — Telephone Encounter (Signed)
PT BROKER HER HIP LATE OCT AND CAN NOT GET AROUND, WAS TOLD SHE WAS DENIED RX-NEEDS AN OFFICE VISIT. PT  WOULD LIKE TO WAIT UNTIL MARCH FOR OV WITH DR MCKEOWN.  PLEASE ADVISE

## 2013-12-28 NOTE — Telephone Encounter (Signed)
PT BROKE HER HIP 09-28-13 AND UNABLE TO GET AROUND.  WOULD LIKE TO WAIT TO SEE DR MCK IN MARCH APPT.

## 2014-01-02 ENCOUNTER — Other Ambulatory Visit: Payer: Self-pay | Admitting: Physician Assistant

## 2014-01-02 NOTE — Telephone Encounter (Signed)
Patient advised Xanax called in and to keep her March appt

## 2014-01-24 ENCOUNTER — Other Ambulatory Visit: Payer: Self-pay | Admitting: Internal Medicine

## 2014-01-24 DIAGNOSIS — Z1231 Encounter for screening mammogram for malignant neoplasm of breast: Secondary | ICD-10-CM

## 2014-01-30 ENCOUNTER — Other Ambulatory Visit: Payer: Self-pay | Admitting: Physician Assistant

## 2014-01-30 ENCOUNTER — Other Ambulatory Visit: Payer: Self-pay | Admitting: Emergency Medicine

## 2014-01-30 MED ORDER — METFORMIN HCL ER (MOD) 1000 MG PO TB24: 1000.0000 mg | ORAL_TABLET | Freq: Two times a day (BID) | ORAL | Status: DC

## 2014-02-01 ENCOUNTER — Other Ambulatory Visit: Payer: Self-pay | Admitting: Internal Medicine

## 2014-02-01 ENCOUNTER — Ambulatory Visit (HOSPITAL_COMMUNITY)
Admission: RE | Admit: 2014-02-01 | Discharge: 2014-02-01 | Disposition: A | Discharge status: Home / Self Care | Payer: Medicare Other | Source: Ambulatory Visit | Attending: Internal Medicine | Admitting: Internal Medicine

## 2014-02-01 DIAGNOSIS — Z1231 Encounter for screening mammogram for malignant neoplasm of breast: Secondary | ICD-10-CM

## 2014-02-20 ENCOUNTER — Ambulatory Visit (INDEPENDENT_AMBULATORY_CARE_PROVIDER_SITE_OTHER): Payer: Medicare Other | Admitting: Physician Assistant

## 2014-02-20 ENCOUNTER — Encounter: Payer: Self-pay | Admitting: Physician Assistant

## 2014-02-20 VITALS — BP 110/62 | HR 76 | Temp 97.9°F | Resp 16 | Ht 63.0 in | Wt 163.0 lb

## 2014-02-20 DIAGNOSIS — Z79899 Other long term (current) drug therapy: Secondary | ICD-10-CM

## 2014-02-20 DIAGNOSIS — F32A Depression, unspecified: Secondary | ICD-10-CM

## 2014-02-20 DIAGNOSIS — E785 Hyperlipidemia, unspecified: Secondary | ICD-10-CM

## 2014-02-20 DIAGNOSIS — S72002A Fracture of unspecified part of neck of left femur, initial encounter for closed fracture: Secondary | ICD-10-CM

## 2014-02-20 DIAGNOSIS — E559 Vitamin D deficiency, unspecified: Secondary | ICD-10-CM

## 2014-02-20 DIAGNOSIS — Z1159 Encounter for screening for other viral diseases: Secondary | ICD-10-CM

## 2014-02-20 DIAGNOSIS — I10 Essential (primary) hypertension: Secondary | ICD-10-CM

## 2014-02-20 DIAGNOSIS — D62 Acute posthemorrhagic anemia: Secondary | ICD-10-CM

## 2014-02-20 DIAGNOSIS — E119 Type 2 diabetes mellitus without complications: Secondary | ICD-10-CM

## 2014-02-20 DIAGNOSIS — F329 Major depressive disorder, single episode, unspecified: Secondary | ICD-10-CM

## 2014-02-20 LAB — CBC WITH DIFFERENTIAL/PLATELET
BASOS ABS: 0.1 10*3/uL (ref 0.0–0.1)
Basophils Relative: 1 % (ref 0–1)
Eosinophils Absolute: 0.3 10*3/uL (ref 0.0–0.7)
Eosinophils Relative: 5 % (ref 0–5)
HCT: 35.7 % — ABNORMAL LOW (ref 36.0–46.0)
HEMOGLOBIN: 11.5 g/dL — AB (ref 12.0–15.0)
LYMPHS PCT: 29 % (ref 12–46)
Lymphs Abs: 1.7 10*3/uL (ref 0.7–4.0)
MCH: 26.5 pg (ref 26.0–34.0)
MCHC: 32.2 g/dL (ref 30.0–36.0)
MCV: 82.3 fL (ref 78.0–100.0)
MONO ABS: 0.6 10*3/uL (ref 0.1–1.0)
Monocytes Relative: 10 % (ref 3–12)
NEUTROS ABS: 3.2 10*3/uL (ref 1.7–7.7)
NEUTROS PCT: 55 % (ref 43–77)
Platelets: 333 10*3/uL (ref 150–400)
RBC: 4.34 MIL/uL (ref 3.87–5.11)
RDW: 15.3 % (ref 11.5–15.5)
WBC: 5.9 10*3/uL (ref 4.0–10.5)

## 2014-02-20 NOTE — Progress Notes (Signed)
HPI 63 y.o. female  presents for 3 month follow up with hypertension, hyperlipidemia, diabetes and vitamin D. Her blood pressure has been controlled at home, today their BP is BP: 110/62 mmHg She does not workout. She denies chest pain, shortness of breath, dizziness. She does have some fatigue.  She is not on cholesterol medication and denies myalgias. Her cholesterol is at goal. The cholesterol last visit was:  LDL 64.  She has been working on diet and exercise for Diabetes, she has not been checking her sugars but while in rehab she states they checked it daily and they were good and denies nausea, paresthesia of the feet, polydipsia and polyuria. Last A1C in the office was:  Lab Results  Component Value Date   HGBA1C 6.8* 09/28/2013   Patient is on Vitamin D supplement.   She broke her left hip and Left intramedullary nail, intertrochanteric by Dr. Ave Filterhandler on 09/29/2013, she was then put in rehab facility. She is still walking with a cane and doing PT. She states her last DEXA was 3-4 years ago. She had blood loss anemia and got a blood transfusion.  Lab Results  Component Value Date   WBC 7.6 10/02/2013   HGB 10.2* 10/02/2013   HCT 31.5* 10/02/2013   MCV 83.8 10/02/2013   PLT 272 10/02/2013    Current Medications:  Current Outpatient Prescriptions on File Prior to Visit  Medication Sig Dispense Refill  . ALPRAZolam (XANAX) 0.5 MG tablet TAKE 1/2 OR 1 TABLET BY MOUTH 3 TIMES A DAY AS NEEDED  90 tablet  0  . cholecalciferol (VITAMIN D) 400 UNITS TABS tablet Take 400 Units by mouth daily.      . CRESTOR 20 MG tablet Take 20 mg by mouth every evening.      . metFORMIN (GLUMETZA) 1000 MG (MOD) 24 hr tablet Take 1 tablet (1,000 mg total) by mouth 2 (two) times daily with a meal.  60 tablet  3  . quinapril-hydrochlorothiazide (ACCURETIC) 20-12.5 MG per tablet Take 1 tablet by mouth 2 (two) times daily.       . sertraline (ZOLOFT) 100 MG tablet Take 100 mg by mouth every evening.       No  current facility-administered medications on file prior to visit.   Medical History:  Past Medical History  Diagnosis Date  . Hypertension   . Hyperlipidemia   . Fibromyalgia   . Back pain   . Diabetes mellitus without complication   . Type II or unspecified type diabetes mellitus without mention of complication, not stated as uncontrolled   . History of kidney stones   . Vitamin D deficiency    Allergies:  Allergies  Allergen Reactions  . Other Other (See Comments)    Unknown anesthetic; unknown reaction; Anesthetic was given during back surgery     Review of Systems: [X]  = complains of  [ ]  = denies  General: Fatigue Arly.Keller[X ] Fever [ ]  Chills [ ]  Weakness [ ]   Insomnia [ ]  Eyes: Redness [ ]  Blurred vision [ ]  Diplopia [ ]   ENT: Congestion [ ]  Sinus Pain [ ]  Post Nasal Drip [ ]  Sore Throat [ ]  Earache [ ]   Cardiac: Chest pain/pressure [ ]  SOB [ ]  Orthopnea [ ]   Palpitations [ ]   Paroxysmal nocturnal dyspnea[ ]  Claudication [ ]  Edema Arly.Keller[X ]  Pulmonary: Cough [ ]  Wheezing[ ]   SOB [ ]   Snoring [ ]   GI: Nausea [ ]  Vomiting[ ]  Dysphagia[ ]  Heartburn[ ]   Abdominal pain [ ]  Constipation [ ] ; Diarrhea [ ] ; BRBPR [ ]  Melena[ ]  GU: Hematuria[ ]  Dysuria [ ]  Nocturia[ ]  Urgency [ ]   Hesitancy [ ]  Discharge [ ]  Neuro: Headaches[ ]  Vertigo[ ]  Paresthesias[ ]  Spasm [ ]  Speech changes [ ]  Incoordination [ ]   Ortho: Arthritis [ ]  Joint pain Arly.Keller ] Muscle pain [ ]  Joint swelling [ ]  Back Pain [ ]  Skin:  Rash [ ]   Pruritis [ ]  Change in skin lesion [ ]   Psych: Depression[ ]  Anxiety[ ]  Confusion [ ]  Memory loss [ ]   Heme/Lypmh: Bleeding [ ]  Bruising [ ]  Enlarged lymph nodes [ ]   Endocrine: Visual blurring [ ]  Paresthesia [ ]  Polyuria [ ]  Polydypsea [ ]    Heat/cold intolerance [ ]  Hypoglycemia [ ]   Family history- Review and unchanged Social history- Review and unchanged Physical Exam: BP 110/62  Pulse 76  Temp(Src) 97.9 F (36.6 C)  Resp 16  Ht 5\' 3"  (1.6 m)  Wt 163 lb (73.936 kg)  BMI 28.88  kg/m2 Wt Readings from Last 3 Encounters:  02/20/14 163 lb (73.936 kg)  09/29/13 170 lb (77.111 kg)  09/29/13 170 lb (77.111 kg)   General Appearance: Well nourished, in no apparent distress. Eyes: PERRLA, EOMs, conjunctiva no swelling or erythema Sinuses: No Frontal/maxillary tenderness ENT/Mouth: Ext aud canals clear, TMs without erythema, bulging. No erythema, swelling, or exudate on post pharynx.  Tonsils not swollen or erythematous. Hearing normal.  Neck: Supple, thyroid normal.  Respiratory: Respiratory effort normal, BS equal bilaterally without rales, rhonchi, wheezing or stridor.  Cardio: RRR with no MRGs. Brisk peripheral pulses with mild edema in left leg Abdomen: Soft, + BS.  Non tender, no guarding, rebound, hernias, masses. Lymphatics: Non tender without lymphadenopathy.  Musculoskeletal: Full ROM, decreased strength on left leg with antalgic gait with cane.  Skin: Warm, dry without rashes, lesions, ecchymosis.  Neuro: Cranial nerves intact. No cerebellar symptoms. Sensation intact.  Psych: Awake and oriented X 3, normal affect, Insight and Judgment appropriate.   Assessment and Plan:  Hypertension: Continue medication, monitor blood pressure at home. Continue DASH diet. Cholesterol: Continue diet and exercise. Check cholesterol.  Diabetes-Continue diet and exercise. Check A1C Vitamin D Def- check level and continue medications.  Anemia s/p surgery- recheck iron,and CBC Son with hepatitis C- would like testing.   Continue diet and meds as discussed. Further disposition pending results of labs. Discussed med's effects and SE's.    [name] 4:22 PM

## 2014-02-21 LAB — BASIC METABOLIC PANEL WITH GFR
BUN: 20 mg/dL (ref 6–23)
CHLORIDE: 104 meq/L (ref 96–112)
CO2: 25 mEq/L (ref 19–32)
Calcium: 10.9 mg/dL — ABNORMAL HIGH (ref 8.4–10.5)
Creat: 0.89 mg/dL (ref 0.50–1.10)
GFR, EST AFRICAN AMERICAN: 79 mL/min
GFR, Est Non African American: 69 mL/min
Glucose, Bld: 102 mg/dL — ABNORMAL HIGH (ref 70–99)
POTASSIUM: 4.3 meq/L (ref 3.5–5.3)
SODIUM: 148 meq/L — AB (ref 135–145)

## 2014-02-21 LAB — HEPATIC FUNCTION PANEL
ALK PHOS: 114 U/L (ref 39–117)
ALT: 16 U/L (ref 0–35)
AST: 21 U/L (ref 0–37)
Albumin: 4.3 g/dL (ref 3.5–5.2)
BILIRUBIN DIRECT: 0.1 mg/dL (ref 0.0–0.3)
BILIRUBIN INDIRECT: 0.4 mg/dL (ref 0.2–1.2)
TOTAL PROTEIN: 6.8 g/dL (ref 6.0–8.3)
Total Bilirubin: 0.5 mg/dL (ref 0.2–1.2)

## 2014-02-21 LAB — IRON AND TIBC
%SAT: 24 % (ref 20–55)
Iron: 75 ug/dL (ref 42–145)
TIBC: 312 ug/dL (ref 250–470)
UIBC: 237 ug/dL (ref 125–400)

## 2014-02-21 LAB — HEPATITIS C ANTIBODY: HCV Ab: NEGATIVE

## 2014-02-21 LAB — LIPID PANEL
Cholesterol: 141 mg/dL (ref 0–200)
HDL: 50 mg/dL (ref >39)
LDL CALC: 56 mg/dL (ref 0–99)
TRIGLYCERIDES: 177 mg/dL — AB (ref <150)
Total CHOL/HDL Ratio: 2.8 Ratio
VLDL: 35 mg/dL (ref 0–40)

## 2014-02-21 LAB — HEMOGLOBIN A1C
Hgb A1c MFr Bld: 6.7 % — ABNORMAL HIGH (ref <5.7)
Mean Plasma Glucose: 146 mg/dL — ABNORMAL HIGH (ref <117)

## 2014-02-21 LAB — TSH: TSH: 1.549 u[IU]/mL (ref 0.350–4.500)

## 2014-02-21 LAB — VITAMIN D 25 HYDROXY (VIT D DEFICIENCY, FRACTURES): Vit D, 25-Hydroxy: 96 ng/mL — ABNORMAL HIGH (ref 30–89)

## 2014-02-21 LAB — HEPATITIS B SURFACE ANTIBODY,QUALITATIVE: Hep B S Ab: NEGATIVE

## 2014-02-21 LAB — MAGNESIUM: Magnesium: 1.7 mg/dL (ref 1.5–2.5)

## 2014-02-21 LAB — HEPATITIS B CORE ANTIBODY, TOTAL: Hep B Core Total Ab: NONREACTIVE

## 2014-02-21 LAB — HEPATITIS A ANTIBODY, TOTAL: Hep A Total Ab: NONREACTIVE

## 2014-02-21 LAB — INSULIN, FASTING: INSULIN FASTING, SERUM: 34 u[IU]/mL — AB (ref 3–28)

## 2014-02-21 LAB — FERRITIN: FERRITIN: 74 ng/mL (ref 10–291)

## 2014-02-22 LAB — HEPATITIS B E ANTIBODY: Hepatitis Be Antibody: NONREACTIVE

## 2014-03-02 ENCOUNTER — Other Ambulatory Visit: Payer: Self-pay | Admitting: Physician Assistant

## 2014-03-03 ENCOUNTER — Other Ambulatory Visit: Payer: Self-pay | Admitting: Emergency Medicine

## 2014-03-03 ENCOUNTER — Telehealth: Payer: Self-pay | Admitting: Internal Medicine

## 2014-03-03 MED ORDER — ALPRAZOLAM 0.5 MG PO TABS: ORAL_TABLET | ORAL | Status: DC

## 2014-03-03 NOTE — Telephone Encounter (Signed)
Patient called to get a refill on alprazolam, [facility] [address]  Please advise patient, she said she is out.  Thanks, Katrina

## 2014-05-02 ENCOUNTER — Other Ambulatory Visit: Payer: Self-pay | Admitting: Emergency Medicine

## 2014-05-17 ENCOUNTER — Encounter: Payer: Self-pay | Admitting: Physician Assistant

## 2014-05-29 ENCOUNTER — Ambulatory Visit (INDEPENDENT_AMBULATORY_CARE_PROVIDER_SITE_OTHER): Payer: 59 | Admitting: Physician Assistant

## 2014-05-29 ENCOUNTER — Encounter: Payer: Self-pay | Admitting: Physician Assistant

## 2014-05-29 ENCOUNTER — Other Ambulatory Visit: Payer: Self-pay | Admitting: Physician Assistant

## 2014-05-29 VITALS — BP 128/78 | HR 72 | Temp 97.9°F | Resp 16 | Ht 63.0 in | Wt 167.0 lb

## 2014-05-29 DIAGNOSIS — Z9181 History of falling: Secondary | ICD-10-CM

## 2014-05-29 DIAGNOSIS — E559 Vitamin D deficiency, unspecified: Secondary | ICD-10-CM

## 2014-05-29 DIAGNOSIS — Z79899 Other long term (current) drug therapy: Secondary | ICD-10-CM

## 2014-05-29 DIAGNOSIS — E119 Type 2 diabetes mellitus without complications: Secondary | ICD-10-CM

## 2014-05-29 DIAGNOSIS — F32A Depression, unspecified: Secondary | ICD-10-CM

## 2014-05-29 DIAGNOSIS — Z0000 Encounter for general adult medical examination without abnormal findings: Secondary | ICD-10-CM

## 2014-05-29 DIAGNOSIS — E785 Hyperlipidemia, unspecified: Secondary | ICD-10-CM

## 2014-05-29 DIAGNOSIS — F329 Major depressive disorder, single episode, unspecified: Secondary | ICD-10-CM

## 2014-05-29 DIAGNOSIS — Z23 Encounter for immunization: Secondary | ICD-10-CM

## 2014-05-29 DIAGNOSIS — I10 Essential (primary) hypertension: Secondary | ICD-10-CM

## 2014-05-29 LAB — CBC WITH DIFFERENTIAL/PLATELET
BASOS PCT: 0 % (ref 0–1)
Basophils Absolute: 0 10*3/uL (ref 0.0–0.1)
EOS ABS: 0.1 10*3/uL (ref 0.0–0.7)
EOS PCT: 2 % (ref 0–5)
HCT: 37.3 % (ref 36.0–46.0)
HEMOGLOBIN: 12.1 g/dL (ref 12.0–15.0)
Lymphocytes Relative: 29 % (ref 12–46)
Lymphs Abs: 2 10*3/uL (ref 0.7–4.0)
MCH: 26.8 pg (ref 26.0–34.0)
MCHC: 32.4 g/dL (ref 30.0–36.0)
MCV: 82.5 fL (ref 78.0–100.0)
MONO ABS: 0.6 10*3/uL (ref 0.1–1.0)
MONOS PCT: 8 % (ref 3–12)
Neutro Abs: 4.3 10*3/uL (ref 1.7–7.7)
Neutrophils Relative %: 61 % (ref 43–77)
Platelets: 312 10*3/uL (ref 150–400)
RBC: 4.52 MIL/uL (ref 3.87–5.11)
RDW: 14.9 % (ref 11.5–15.5)
WBC: 7 10*3/uL (ref 4.0–10.5)

## 2014-05-29 MED ORDER — CITALOPRAM HYDROBROMIDE 40 MG PO TABS: 40.0000 mg | ORAL_TABLET | Freq: Every day | ORAL | Status: DC

## 2014-05-29 MED ORDER — PNEUMOCOCCAL 13-VAL CONJ VACC IM SUSP: 0.5000 mL | INTRAMUSCULAR | Status: DC

## 2014-05-29 MED ORDER — ALPRAZOLAM 0.5 MG PO TABS: ORAL_TABLET | ORAL | Status: DC

## 2014-05-29 NOTE — Progress Notes (Signed)
MEDICARE ANNUAL WELLNESS VISIT AND CPE  Assessment:   1. Essential hypertension - CBC with Differential - BASIC METABOLIC PANEL WITH GFR - Hepatic function panel - TSH - EKG 12-Lead - [place], RETROPERITNL ABD,  LTD - Urinalysis, Routine w reflex microscopic - Microalbumin / creatinine urine ratio  2. Type II or unspecified type diabetes mellitus without mention of complication, not stated as uncontrolled Discussed general issues about diabetes pathophysiology and management., Educational material distributed., Suggested low cholesterol diet., Encouraged aerobic exercise., Discussed foot care., Reminded to get yearly retinal exam. - Hemoglobin A1c - Insulin, fasting  3. Depression  4. Hyperlipidemia - Lipid panel  5. Vitamin D deficiency - Vit D  25 hydroxy (rtn osteoporosis monitoring)  6. Encounter for long-term (current) use of other medications - Magnesium  7. Need for prophylactic vaccination against Streptococcus pneumoniae (pneumococcus) - pneumococcal 13-valent conjugate vaccine (PREVNAR 13) injection 0.5 mL; Inject 0.5 mLs into the muscle tomorrow at 10 am.  8. Hot flashes She does not want estrogen, will switch zoloft to Celexa 40    Plan:   During the course of the visit the patient was educated and counseled about appropriate screening and preventive services including:    Pneumococcal vaccine   Influenza vaccine  Td vaccine  Screening electrocardiogram  Screening mammography  Bone densitometry screening  Colorectal cancer screening  Diabetes screening  Glaucoma screening  Nutrition counseling   Advanced directives: given information/requested  Screening recommendations, referrals:  Vaccinations: Tdap vaccine not indicated Influenza vaccine not indicated Pneumococcal vaccine not indicated Shingles vaccine not indicated Hep B vaccine not indicated  Nutrition assessed and recommended  Colonoscopy due next year Mammogram due 01/2015 Pap  smear not indicated Pelvic exam not indicated Recommended yearly ophthalmology/optometry visit for glaucoma screening and checkup Recommended yearly dental visit for hygiene and checkup Advanced directives - requested  Conditions/risks identified: BMI: Discussed weight loss, diet, and increase physical activity.  Increase physical activity: AHA recommends 150 minutes of physical activity a week.  Medications reviewed DEXA- up to date Diabetes at goal, ACE/ARB therapy Yes. Urinary Incontinence is not an issue: discussed non pharmacology and pharmacology options.  Fall risk: moderate- discussed PT, home fall assessment, medications.   Subjective:   [name] is a 63 y.o. female who presents for Medicare Annual Wellness Visit and complete physical.    Date of last medicare wellness visit is unknown.  Her blood pressure has been controlled at home, today their BP is BP: 128/78 mmHg She does not workout. She denies chest pain, shortness of breath, dizziness.  She is on cholesterol medication and denies myalgias. Her cholesterol is at goal. The cholesterol last visit was:   Lab Results  Component Value Date   CHOL 141 02/20/2014   HDL 50 02/20/2014   LDLCALC 56 02/20/2014   TRIG 177* 02/20/2014   CHOLHDL 2.8 02/20/2014   She has been working on diet and exercise for diabetes, she does not check her sugars regularly, and denies paresthesia of the feet, polydipsia and polyuria. Last A1C in the office was:  Lab Results  Component Value Date   HGBA1C 6.7* 02/20/2014   Patient is on Vitamin D supplement.   Lab Results  Component Value Date   VD25OH 96* 02/20/2014     She broke her left hip in Oct 2014 and had surgery by Dr. Ave Filter. She states she continues to have pain in this hip, worse with sitting for a long time, last saw Dr. Ave Filter in May.  She  was on premarin at one point for hot flashes and she is no longer on it and she states the hot flashes are wrose  Names of Other  Physician/Practitioners you currently use: 1. [facility]- here for primary care 2. [facility], eye doctor, last visit May 2015 3. Dr. Ashley Royalty, retina specialist, sees him next week for retinopathy 4. Dr. Vonna Kotyk, will remove cataratcs 3. Dr. Shea Evans in [employer], dentist, last visit q 6 months Patient Care Team: Lucky Cowboy, MD as PCP - General (Internal Medicine) Caryl Never, MD (Dentistry) Mia Creek, MD as Consulting Physician (Ophthalmology) Griffith Citron, MD as Consulting Physician (Gastroenterology) Foster Simpson, MD as Consulting Physician (Obstetrics and Gynecology) Paulina Fusi, MD as Referring Physician (Orthopedic Surgery) V Charlesetta Shanks, MD as Consulting Physician (Orthopedic Surgery) Luetta Nutting, MD as Referring Physician (Orthopedic Surgery)   Medication Review Current Outpatient Prescriptions on File Prior to Visit  Medication Sig Dispense Refill  . ALPRAZolam (XANAX) 0.5 MG tablet TAKE 1/2 TO 1 TABLET 3 TIMES A DAY AS NEEDED *GREENSTONE BRAND*  90 tablet  0  . aspirin EC 325 MG tablet Take 325 mg by mouth once.      . cholecalciferol (VITAMIN D) 400 UNITS TABS tablet Take 400 Units by mouth daily.      . CRESTOR 20 MG tablet Take 20 mg by mouth every evening.      . metFORMIN (GLUMETZA) 1000 MG (MOD) 24 hr tablet Take 1 tablet (1,000 mg total) by mouth 2 (two) times daily with a meal.  60 tablet  3  . quinapril-hydrochlorothiazide (ACCURETIC) 20-12.5 MG per tablet Take 1 tablet by mouth 2 (two) times daily.       . sertraline (ZOLOFT) 100 MG tablet Take 100 mg by mouth every evening.       No current facility-administered medications on file prior to visit.    Current Problems (verified) Patient Active Problem List   Diagnosis Date Noted  . Type II or unspecified type diabetes mellitus without mention of complication, not stated as uncontrolled   . Back pain   . History of kidney stones   . Vitamin D  deficiency   . Depression 11/16/2013  . Hyperlipidemia 11/16/2013  . UTI (urinary tract infection) 10/24/2013  . Postoperative anemia due to acute blood loss 10/03/2013  . Closed left hip fracture 09/28/2013  . Hypertension 09/28/2013    Screening Tests Health Maintenance  Topic Date Due  . Pap Smear  11/20/1967  . Zostavax  11/19/2009  . Influenza Vaccine  07/01/2014  . Mammogram  02/02/2016  . Tetanus/tdap  05/06/2016  . Colonoscopy  02/12/2022    Immunization History  Administered Date(s) Administered  . Influenza Split 08/17/2013  . Td 05/06/2006    Preventative care: Last colonoscopy: 2013 due 2016 Last mammogram: 01/2014 Last pap smear/pelvic exam: 2013 negative no more after TAH  DEXA: 2014 good  Prior vaccinations: TD or Tdap: 2007  Influenza: 2014 Pneumococcal: 2008 Shingles/Zostavax: 2012  History reviewed: allergies, current medications, past family history, past medical history, past social history, past surgical history and problem list  Risk Factors: Osteoporosis: postmenopausal estrogen deficiency and dietary calcium and/or vitamin D deficiency History of fracture in the past year: yes  Tobacco History  Substance Use Topics  . Smoking status: Never Smoker   . Smokeless tobacco: Never Used  . Alcohol Use: No   She does not smoke.  Patient is not a former smoker. Are there smokers in your  home (other than you)?  No  Alcohol Current alcohol use: none  Caffeine Current caffeine use: coffee 1 /day  Exercise  Current exercise: none and walks dog  Nutrition/Diet Current diet: in general, a "healthy" diet    Cardiac risk factors: diabetes mellitus, dyslipidemia, hypertension, obesity (BMI >= 30 kg/m2) and sedentary lifestyle.  Depression Screen (Note: if answer to either of the following is "Yes", a more complete depression screening is indicated)   Q1: Over the past two weeks, have you felt down, depressed or hopeless? No  Q2: Over the  past two weeks, have you felt little interest or pleasure in doing things? No  Have you lost interest or pleasure in daily life? No  Do you often feel hopeless? No  Do you cry easily over simple problems? No  Activities of Daily Living In your present state of health, do you have any difficulty performing the following activities?:  Driving? No Managing money?  No Feeding yourself? No Getting from bed to chair? No Climbing a flight of stairs? No Preparing food and eating?: No Bathing or showering? No Getting dressed: No Getting to the toilet? No Using the toilet:No Moving around from place to place: No In the past year have you fallen or had a near fall?:Yes   Are you sexually active?  No  Do you have more than one partner?  No  Vision Difficulties: Yes  Hearing Difficulties: No Do you often ask people to speak up or repeat themselves? No Do you experience ringing or noises in your ears? No Do you have difficulty understanding soft or whispered voices? No  Cognition  Do you feel that you have a problem with memory?No  Do you often misplace items? No  Do you feel safe at home?  Yes  Advanced directives Does patient have a Health Care Power of Attorney? No Does patient have a Living Will? No   Objective:     Blood pressure 128/78, pulse 72, temperature 97.9 F (36.6 C), resp. rate 16, height 5\' 3"  (1.6 m), weight 167 lb (75.751 kg). Body mass index is 29.59 kg/(m^2).  General appearance: alert, no distress, WD/WN,  female Cognitive Testing  Alert? Yes  Normal Appearance?Yes  Oriented to person? Yes  Place? Yes   Time? Yes  Recall of three objects?  Yes  Can perform simple calculations? Yes  Displays appropriate judgment?Yes  Can read the correct time from a watch face?Yes  HEENT: normocephalic, sclerae anicteric, TMs pearly, nares patent, no discharge or erythema, pharynx normal Oral cavity: MMM, no lesions Neck: supple, no lymphadenopathy, no thyromegaly, no  masses Heart: RRR, normal S1, S2, no murmurs Lungs: CTA bilaterally, no wheezes, rhonchi, or rales Abdomen: +bs, soft, non tender, non distended, no masses, no hepatomegaly, no splenomegaly Musculoskeletal: nontender, no swelling, no obvious deformity Extremities: no edema, no cyanosis, no clubbing Pulses: 2+ symmetric, upper and lower extremities, normal cap refill Neurological: alert, oriented x 3, CN2-12 intact, strength normal upper extremities and lower extremities, sensation normal throughout, DTRs 2+ throughout, no cerebellar signs, gait normal Psychiatric: normal affect, behavior normal, pleasant  Breast: defer Gyn: defer  Rectal: defer  Medicare Attestation I have personally reviewed: The patient's medical and social history Their use of alcohol, tobacco or illicit drugs Their current medications and supplements The patient's functional ability including ADLs,fall risks, home safety risks, cognitive, and hearing and visual impairment Diet and physical activities Evidence for depression or mood disorders  The patient's weight, height, BMI, and visual acuity have  been recorded in the chart.  I have made referrals, counseling, and provided education to the patient based on review of the above and I have provided the patient with a written personalized care plan for preventive services.     Quentin Mulling, PA-C   05/29/2014

## 2014-05-29 NOTE — Patient Instructions (Addendum)
Please stop the zoloft and switch to celexa 40mg , one pill daily for hot flashes.   Preventative Care for Adults - Female      MAINTAIN REGULAR HEALTH EXAMS:  A routine yearly physical is a good way to check in with your primary care provider about your health and preventive screening. It is also an opportunity to share updates about your health and any concerns you have, and receive a thorough all-over exam.   Most health insurance companies pay for at least some preventative services.  Check with your health plan for specific coverages.  WHAT PREVENTATIVE SERVICES DO WOMEN NEED?  Adult women should have their weight and blood pressure checked regularly.   Women age 65 and older should have their cholesterol levels checked regularly.  Women should be screened for cervical cancer with a Pap smear and pelvic exam beginning at either age 65, or 3 years after they become sexually activity.    Breast cancer screening generally begins at age 65 with a mammogram and breast exam by your primary care provider.    Beginning at age 65 and continuing to age 65, women should be screened for colorectal cancer.  Certain people may need continued testing until age 65.  Updating vaccinations is part of preventative care.  Vaccinations help protect against diseases such as the flu.  Osteoporosis is a disease in which the bones lose minerals and strength as we age. Women ages 6565 and over should discuss this with their caregivers, as should women after menopause who have other risk factors.  Lab tests are generally done as part of preventative care to screen for anemia and blood disorders, to screen for problems with the kidneys and liver, to screen for bladder problems, to check blood sugar, and to check your cholesterol level.  Preventative services generally include counseling about diet, exercise, avoiding tobacco, drugs, excessive alcohol consumption, and sexually transmitted infections.    GENERAL  RECOMMENDATIONS FOR GOOD HEALTH:  Healthy diet:  Eat a variety of foods, including fruit, vegetables, animal or vegetable protein, such as meat, fish, chicken, and eggs, or beans, lentils, tofu, and grains, such as rice.  Drink plenty of water daily.  Decrease saturated fat in the diet, avoid lots of red meat, processed foods, sweets, fast foods, and fried foods.  Exercise:  Aerobic exercise helps maintain good heart health. At least 30-40 minutes of moderate-intensity exercise is recommended. For example, a brisk walk that increases your heart rate and breathing. This should be done on most days of the week.   Find a type of exercise or a variety of exercises that you enjoy so that it becomes a part of your daily life.  Examples are running, walking, swimming, water aerobics, and biking.  For motivation and support, explore group exercise such as aerobic class, spin class, Zumba, Yoga,or  martial arts, etc.    Set exercise goals for yourself, such as a certain weight goal, walk or run in a race such as a 5k walk/run.  Speak to your primary care provider about exercise goals.  Disease prevention:  If you smoke or chew tobacco, find out from your caregiver how to quit. It can literally save your life, no matter how long you have been a tobacco user. If you do not use tobacco, never begin.   Maintain a healthy diet and normal weight. Increased weight leads to problems with blood pressure and diabetes.   The Body Mass Index or BMI is a way of measuring how  much of your body is fat. Having a BMI above 27 increases the risk of heart disease, diabetes, hypertension, stroke and other problems related to obesity. Your caregiver can help determine your BMI and based on it develop an exercise and dietary program to help you achieve or maintain this important measurement at a healthful level.  High blood pressure causes heart and blood vessel problems.  Persistent high blood pressure should be treated  with medicine if weight loss and exercise do not work.   Fat and cholesterol leaves deposits in your arteries that can block them. This causes heart disease and vessel disease elsewhere in your body.  If your cholesterol is found to be high, or if you have heart disease or certain other medical conditions, then you may need to have your cholesterol monitored frequently and be treated with medication.   Ask if you should have a cardiac stress test if your history suggests this. A stress test is a test done on a treadmill that looks for heart disease. This test can find disease prior to there being a problem.  Menopause can be associated with physical symptoms and risks. Hormone replacement therapy is available to decrease these. You should talk to your caregiver about whether starting or continuing to take hormones is right for you.   Osteoporosis is a disease in which the bones lose minerals and strength as we age. This can result in serious bone fractures. Risk of osteoporosis can be identified using a bone density scan. Women ages 72 and over should discuss this with their caregivers, as should women after menopause who have other risk factors. Ask your caregiver whether you should be taking a calcium supplement and Vitamin D, to reduce the rate of osteoporosis.   Avoid drinking alcohol in excess (more than two drinks per day).  Avoid use of street drugs. Do not share needles with anyone. Ask for professional help if you need assistance or instructions on stopping the use of alcohol, cigarettes, and/or drugs.  Brush your teeth twice a day with fluoride toothpaste, and floss once a day. Good oral hygiene prevents tooth decay and gum disease. The problems can be painful, unattractive, and can cause other health problems. Visit your dentist for a routine oral and dental check up and preventive care every 6-12 months.   Look at your skin regularly.  Use a mirror to look at your back. Notify your  caregivers of changes in moles, especially if there are changes in shapes, colors, a size larger than a pencil eraser, an irregular border, or development of new moles.  Safety:  Use seatbelts 100% of the time, whether driving or as a passenger.  Use safety devices such as hearing protection if you work in environments with loud noise or significant background noise.  Use safety glasses when doing any work that could send debris in to the eyes.  Use a helmet if you ride a bike or motorcycle.  Use appropriate safety gear for contact sports.  Talk to your caregiver about gun safety.  Use sunscreen with a SPF (or skin protection factor) of 15 or greater.  Lighter skinned people are at a greater risk of skin cancer. Don't forget to also wear sunglasses in order to protect your eyes from too much damaging sunlight. Damaging sunlight can accelerate cataract formation.   Practice safe sex. Use condoms. Condoms are used for birth control and to help reduce the spread of sexually transmitted infections (or STIs).  Some of the STIs  are gonorrhea (the clap), chlamydia, syphilis, trichomonas, herpes, HPV (human papilloma virus) and HIV (human immunodeficiency virus) which causes AIDS. The herpes, HIV and HPV are viral illnesses that have no cure. These can result in disability, cancer and death.   Keep carbon monoxide and smoke detectors in your home functioning at all times. Change the batteries every 6 months or use a model that plugs into the wall.   Vaccinations:  Stay up to date with your tetanus shots and other required immunizations. You should have a booster for tetanus every 10 years. Be sure to get your flu shot every year, since 5%-20% of the [place] population comes down with the flu. The flu vaccine changes each year, so being vaccinated once is not enough. Get your shot in the fall, before the flu season peaks.   Other vaccines to consider:  Human Papilloma Virus or HPV causes cancer of the cervix,  and other infections that can be transmitted from person to person. There is a vaccine for HPV, and females should get immunized between the ages of 3811 and 626. It requires a series of 3 shots.   Pneumococcal vaccine to protect against certain types of pneumonia.  This is normally recommended for adults age 65 or older.  However, adults younger than 65 years old with certain underlying conditions such as diabetes, heart or lung disease should also receive the vaccine.  Shingles vaccine to protect against Varicella Zoster if you are older than age 65, or younger than 65 years old with certain underlying illness.  Hepatitis A vaccine to protect against a form of infection of the liver by a virus acquired from food.  Hepatitis B vaccine to protect against a form of infection of the liver by a virus acquired from blood or body fluids, particularly if you work in health care.  If you plan to travel internationally, check with your local health department for specific vaccination recommendations.  Cancer Screening:  Breast cancer screening is essential to preventive care for women. All women age 65 and older should perform a breast self-exam every month. At age 65 and older, women should have their caregiver complete a breast exam each year. Women at ages 4240 and older should have a mammogram (x-ray film) of the breasts. Your caregiver can discuss how often you need mammograms.    Cervical cancer screening includes taking a Pap smear (sample of cells examined under a microscope) from the cervix (end of the uterus). It also includes testing for HPV (Human Papilloma Virus, which can cause cervical cancer). Screening and a pelvic exam should begin at age 65, or 3 years after a woman becomes sexually active. Screening should occur every year, with a Pap smear but no HPV testing, up to age 65. After age 65, you should have a Pap smear every 3 years with HPV testing, if no HPV was found previously.   Most  routine colon cancer screening begins at the age of 65. On a yearly basis, doctors may provide special easy to use take-home tests to check for hidden blood in the stool. Sigmoidoscopy or colonoscopy can detect the earliest forms of colon cancer and is life saving. These tests use a small camera at the end of a tube to directly examine the colon. Speak to your caregiver about this at age 65, when routine screening begins (and is repeated every 5 years unless early forms of pre-cancerous polyps or small growths are found).

## 2014-05-30 ENCOUNTER — Other Ambulatory Visit: Payer: Self-pay | Admitting: Emergency Medicine

## 2014-05-30 LAB — URINALYSIS, ROUTINE W REFLEX MICROSCOPIC
Bilirubin Urine: NEGATIVE
Glucose, UA: NEGATIVE mg/dL
HGB URINE DIPSTICK: NEGATIVE
KETONES UR: NEGATIVE mg/dL
Leukocytes, UA: NEGATIVE
Nitrite: NEGATIVE
PH: 6.5 (ref 5.0–8.0)
Protein, ur: NEGATIVE mg/dL
Specific Gravity, Urine: 1.017 (ref 1.005–1.030)
Urobilinogen, UA: 1 mg/dL (ref 0.0–1.0)

## 2014-05-30 LAB — LIPID PANEL
CHOLESTEROL: 141 mg/dL (ref 0–200)
HDL: 44 mg/dL (ref >39)
LDL CALC: 61 mg/dL (ref 0–99)
Total CHOL/HDL Ratio: 3.2 Ratio
Triglycerides: 178 mg/dL — ABNORMAL HIGH (ref <150)
VLDL: 36 mg/dL (ref 0–40)

## 2014-05-30 LAB — BASIC METABOLIC PANEL WITH GFR
BUN: 19 mg/dL (ref 6–23)
CALCIUM: 10.4 mg/dL (ref 8.4–10.5)
CO2: 28 mEq/L (ref 19–32)
Chloride: 101 mEq/L (ref 96–112)
Creat: 0.83 mg/dL (ref 0.50–1.10)
GFR, Est African American: 86 mL/min
GFR, Est Non African American: 75 mL/min
GLUCOSE: 97 mg/dL (ref 70–99)
Potassium: 4.3 mEq/L (ref 3.5–5.3)
Sodium: 141 mEq/L (ref 135–145)

## 2014-05-30 LAB — HEPATIC FUNCTION PANEL
ALT: 17 U/L (ref 0–35)
AST: 23 U/L (ref 0–37)
Albumin: 4.4 g/dL (ref 3.5–5.2)
Alkaline Phosphatase: 80 U/L (ref 39–117)
BILIRUBIN DIRECT: 0.1 mg/dL (ref 0.0–0.3)
BILIRUBIN INDIRECT: 0.4 mg/dL (ref 0.2–1.2)
Total Bilirubin: 0.5 mg/dL (ref 0.2–1.2)
Total Protein: 6.6 g/dL (ref 6.0–8.3)

## 2014-05-30 LAB — MAGNESIUM: Magnesium: 1.4 mg/dL — ABNORMAL LOW (ref 1.5–2.5)

## 2014-05-30 LAB — HEMOGLOBIN A1C
HEMOGLOBIN A1C: 6.6 % — AB (ref <5.7)
Mean Plasma Glucose: 143 mg/dL — ABNORMAL HIGH (ref <117)

## 2014-05-30 LAB — MICROALBUMIN / CREATININE URINE RATIO
Creatinine, Urine: 168.3 mg/dL
MICROALB/CREAT RATIO: 8.6 mg/g (ref 0.0–30.0)
Microalb, Ur: 1.45 mg/dL (ref 0.00–1.89)

## 2014-05-30 LAB — TSH: TSH: 1.573 u[IU]/mL (ref 0.350–4.500)

## 2014-05-30 LAB — VITAMIN D 25 HYDROXY (VIT D DEFICIENCY, FRACTURES): Vit D, 25-Hydroxy: 88 ng/mL (ref 30–89)

## 2014-05-30 LAB — INSULIN, FASTING: Insulin fasting, serum: 25 u[IU]/mL (ref 3–28)

## 2014-05-31 ENCOUNTER — Other Ambulatory Visit: Payer: Self-pay | Admitting: Emergency Medicine

## 2014-06-01 ENCOUNTER — Other Ambulatory Visit: Payer: Self-pay | Admitting: Emergency Medicine

## 2014-06-05 ENCOUNTER — Encounter (INDEPENDENT_AMBULATORY_CARE_PROVIDER_SITE_OTHER): Payer: Medicare Other | Admitting: Ophthalmology

## 2014-06-05 DIAGNOSIS — E1139 Type 2 diabetes mellitus with other diabetic ophthalmic complication: Secondary | ICD-10-CM

## 2014-06-05 DIAGNOSIS — I10 Essential (primary) hypertension: Secondary | ICD-10-CM

## 2014-06-05 DIAGNOSIS — E1165 Type 2 diabetes mellitus with hyperglycemia: Secondary | ICD-10-CM

## 2014-06-05 DIAGNOSIS — H2510 Age-related nuclear cataract, unspecified eye: Secondary | ICD-10-CM

## 2014-06-05 DIAGNOSIS — E11319 Type 2 diabetes mellitus with unspecified diabetic retinopathy without macular edema: Secondary | ICD-10-CM

## 2014-06-05 DIAGNOSIS — H35039 Hypertensive retinopathy, unspecified eye: Secondary | ICD-10-CM

## 2014-06-05 DIAGNOSIS — H43819 Vitreous degeneration, unspecified eye: Secondary | ICD-10-CM

## 2014-07-26 ENCOUNTER — Other Ambulatory Visit: Payer: Self-pay | Admitting: Physician Assistant

## 2014-07-26 ENCOUNTER — Other Ambulatory Visit: Payer: Self-pay | Admitting: Internal Medicine

## 2014-07-27 ENCOUNTER — Other Ambulatory Visit: Payer: Self-pay | Admitting: Emergency Medicine

## 2014-08-06 ENCOUNTER — Other Ambulatory Visit: Payer: Self-pay | Admitting: Internal Medicine

## 2014-08-27 ENCOUNTER — Other Ambulatory Visit: Payer: Self-pay | Admitting: Physician Assistant

## 2014-09-04 ENCOUNTER — Encounter: Payer: Self-pay | Admitting: Physician Assistant

## 2014-09-04 ENCOUNTER — Ambulatory Visit (INDEPENDENT_AMBULATORY_CARE_PROVIDER_SITE_OTHER): Payer: Medicare Other | Admitting: Physician Assistant

## 2014-09-04 VITALS — BP 120/78 | HR 72 | Temp 98.1°F | Resp 16 | Ht 63.0 in | Wt 171.0 lb

## 2014-09-04 DIAGNOSIS — E559 Vitamin D deficiency, unspecified: Secondary | ICD-10-CM

## 2014-09-04 DIAGNOSIS — F329 Major depressive disorder, single episode, unspecified: Secondary | ICD-10-CM

## 2014-09-04 DIAGNOSIS — Z79899 Other long term (current) drug therapy: Secondary | ICD-10-CM

## 2014-09-04 DIAGNOSIS — F32A Depression, unspecified: Secondary | ICD-10-CM

## 2014-09-04 DIAGNOSIS — N182 Chronic kidney disease, stage 2 (mild): Secondary | ICD-10-CM

## 2014-09-04 DIAGNOSIS — Z23 Encounter for immunization: Secondary | ICD-10-CM

## 2014-09-04 DIAGNOSIS — I10 Essential (primary) hypertension: Secondary | ICD-10-CM

## 2014-09-04 DIAGNOSIS — E785 Hyperlipidemia, unspecified: Secondary | ICD-10-CM

## 2014-09-04 DIAGNOSIS — E1122 Type 2 diabetes mellitus with diabetic chronic kidney disease: Secondary | ICD-10-CM

## 2014-09-04 LAB — TSH: TSH: 1.146 u[IU]/mL (ref 0.350–4.500)

## 2014-09-04 LAB — CBC WITH DIFFERENTIAL/PLATELET
BASOS ABS: 0.1 10*3/uL (ref 0.0–0.1)
Basophils Relative: 1 % (ref 0–1)
Eosinophils Absolute: 0.2 10*3/uL (ref 0.0–0.7)
Eosinophils Relative: 3 % (ref 0–5)
HCT: 36.5 % (ref 36.0–46.0)
HEMOGLOBIN: 11.7 g/dL — AB (ref 12.0–15.0)
LYMPHS ABS: 1.7 10*3/uL (ref 0.7–4.0)
LYMPHS PCT: 28 % (ref 12–46)
MCH: 27 pg (ref 26.0–34.0)
MCHC: 32.1 g/dL (ref 30.0–36.0)
MCV: 84.3 fL (ref 78.0–100.0)
Monocytes Absolute: 0.5 10*3/uL (ref 0.1–1.0)
Monocytes Relative: 8 % (ref 3–12)
NEUTROS ABS: 3.7 10*3/uL (ref 1.7–7.7)
Neutrophils Relative %: 60 % (ref 43–77)
Platelets: 283 10*3/uL (ref 150–400)
RBC: 4.33 MIL/uL (ref 3.87–5.11)
RDW: 14.7 % (ref 11.5–15.5)
WBC: 6.2 10*3/uL (ref 4.0–10.5)

## 2014-09-04 LAB — LIPID PANEL
CHOL/HDL RATIO: 2.4 ratio
CHOLESTEROL: 114 mg/dL (ref 0–200)
HDL: 47 mg/dL (ref >39)
LDL Cholesterol: 32 mg/dL (ref 0–99)
Triglycerides: 175 mg/dL — ABNORMAL HIGH (ref <150)
VLDL: 35 mg/dL (ref 0–40)

## 2014-09-04 LAB — HEPATIC FUNCTION PANEL
ALBUMIN: 4.3 g/dL (ref 3.5–5.2)
ALT: 18 U/L (ref 0–35)
AST: 23 U/L (ref 0–37)
Alkaline Phosphatase: 65 U/L (ref 39–117)
BILIRUBIN TOTAL: 0.4 mg/dL (ref 0.2–1.2)
Bilirubin, Direct: 0.1 mg/dL (ref 0.0–0.3)
Indirect Bilirubin: 0.3 mg/dL (ref 0.2–1.2)
Total Protein: 6.8 g/dL (ref 6.0–8.3)

## 2014-09-04 LAB — BASIC METABOLIC PANEL WITH GFR
BUN: 27 mg/dL — ABNORMAL HIGH (ref 6–23)
CALCIUM: 11 mg/dL — AB (ref 8.4–10.5)
CO2: 28 meq/L (ref 19–32)
Chloride: 104 mEq/L (ref 96–112)
Creat: 0.92 mg/dL (ref 0.50–1.10)
GFR, EST AFRICAN AMERICAN: 76 mL/min
GFR, Est Non African American: 66 mL/min
Glucose, Bld: 193 mg/dL — ABNORMAL HIGH (ref 70–99)
Potassium: 4.1 mEq/L (ref 3.5–5.3)
Sodium: 143 mEq/L (ref 135–145)

## 2014-09-04 LAB — MAGNESIUM: Magnesium: 1.8 mg/dL (ref 1.5–2.5)

## 2014-09-04 NOTE — Patient Instructions (Signed)
Possible nocturnal GERD/refluxPlease take samples of nexium given once in the morning 30min before food for 5 days.  PND- Start fluticasone nasal spray, 2 sprays each nostril at night  Work hard to stop throat clearing.  Plan a time when you will able to practice complete voice rest  Use sugar free candy to ease cough and throat irritation.     Bad carbs also include fruit juice, alcohol, and sweet tea. These are empty calories that do not signal to your brain that you are full.   Please remember the good carbs are still carbs which convert into sugar. So please measure them out no more than 1/2-1 cup of rice, oatmeal, pasta, and beans.  Veggies are however free foods! Pile them on.   I like lean protein at every meal such as chicken, [place], pork chops, cottage cheese, etc. Just do not fry these meats and please center your meal around vegetable, the meats should be a side dish.   No all fruit is created equal. Please see the list below, the fruit at the bottom is higher in sugars than the fruit at the top

## 2014-09-04 NOTE — Progress Notes (Signed)
Assessment and Plan:  Hypertension: Continue medication, monitor blood pressure at home. Continue DASH diet. Cholesterol: Continue diet and exercise. Check cholesterol.  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 Check A1C Vitamin D Def- check level and continue medications.  Cough: Possible nocturnal GERD- nexium samples give PND- start fluticasone nasal spray, 2 sprays each nostril twice a day  If this does not help we will switch her ACE--> ARB but she would prefer to do this last.  Right hip pain/leg discrepancy- she would benefit from a built up shoe to prevent ulcers/callouses.  TDAP due to 1 year old grandson and Influenza regular  Continue diet and meds as discussed. Further disposition pending results of labs. Discussed med's effects and SE's.  OVER 40 minutes of exam, counseling, chart review, referral performed   HPI 63 y.o. female  presents for 3 month follow up with hypertension, hyperlipidemia, diabetes and vitamin D. Her blood pressure has been controlled at home, today their BP is BP: 120/78 mmHg She does workout, walks her dog several times a day  She denies chest pain, shortness of breath, dizziness.  She is on cholesterol medication and denies myalgias. Her cholesterol is at goal. The cholesterol last visit was:   Lab Results  Component Value Date   CHOL 141 05/29/2014   HDL 44 05/29/2014   LDLCALC 61 05/29/2014   TRIG 178* 05/29/2014   CHOLHDL 3.2 05/29/2014   She has been working on diet and exercise for Diabetes, and denies paresthesia of the feet, polydipsia, polyuria and visual disturbances. Last A1C in the office was:  Lab Results  Component Value Date   HGBA1C 6.6* 05/29/2014   Patient is on Vitamin D supplement. Lab Results  Component Value Date   VD25OH 21 05/29/2014     She complains of a nocturnal cough for 2-3 months, denies CP, SOB, wheezing, mucus. Denies GERD/sinus issues. She is on ACE.  She broke her left hip in Oct 2014 and had surgery  by Dr. Ave Filter. She states she continues to have pain in this hip, worse with sitting for a long time, last saw Dr. Ave Filter in May. Since the surgery she has a leg discrepancy, she is now limping and due to this limp/leg discrepancy she is starting to get calloses/ulcers on her feet, especially her left foot.    Current Medications:  Current Outpatient Prescriptions on File Prior to Visit  Medication Sig Dispense Refill  . ALPRAZolam (XANAX) 0.5 MG tablet TAKE 1/2 OR 1 TABLET BY MOUTH 3 TIMES A DAY AS NEEDED  90 tablet  1  . aspirin EC 325 MG tablet Take 325 mg by mouth once.      . cholecalciferol (VITAMIN D) 400 UNITS TABS tablet Take 400 Units by mouth daily.      . citalopram (CELEXA) 40 MG tablet TAKE 1 TABLET (40 MG TOTAL) BY MOUTH DAILY.  30 tablet  2  . CRESTOR 20 MG tablet TAKE 1 TABLET BY MOUTH EVERY DAY  30 tablet  2  . metformin (FORTAMET) 1000 MG (OSM) 24 hr tablet TAKE 1 TABLET TWICE A DAY  60 tablet  3  . Multiple Vitamins-Minerals (MULTIVITAMIN PO) Take by mouth daily.      . quinapril-hydrochlorothiazide (ACCURETIC) 20-12.5 MG per tablet TAKE 2 TABLETS BY MOUTH EVERY DAY FOR BLOOD PRESSURE  180 tablet  1  . sertraline (ZOLOFT) 100 MG tablet Take 200 mg by mouth every evening.        Current Facility-Administered Medications on  File Prior to Visit  Medication Dose Route Frequency Provider Last Rate Last Dose  . pneumococcal 13-valent conjugate vaccine (PREVNAR 13) injection 0.5 mL  0.5 mL Intramuscular Tomorrow-1000 Quentin Mulling, PA-C       Medical History:  Past Medical History  Diagnosis Date  . Hypertension   . Hyperlipidemia   . Fibromyalgia   . Back pain   . Diabetes mellitus without complication   . Type II or unspecified type diabetes mellitus without mention of complication, not stated as uncontrolled   . History of kidney stones   . Vitamin D deficiency    Allergies:  Allergies  Allergen Reactions  . Other Other (See Comments)    Unknown anesthetic;  unknown reaction; Anesthetic was given during back surgery     Review of Systems: [X]  = complains of  [ ]  = denies  General: Fatigue [ ]  Fever [ ]  Chills [ ]  Weakness [ ]   Insomnia [ ]  Eyes: Redness [ ]  Blurred vision [ ]  Diplopia [ ]   ENT: Congestion [ ]  Sinus Pain [ ]  Post Nasal Drip [ ]  Sore Throat [ ]  Earache [ ]   Cardiac: Chest pain/pressure [ ]  SOB [ ]  Orthopnea [ ]   Palpitations [ ]   Paroxysmal nocturnal dyspnea[ ]  Claudication [ ]  Edema [ ]   Pulmonary: Cough [x ] Wheezing[ ]   SOB [ ]   Snoring [ ]   GI: Nausea [ ]  Vomiting[ ]  Dysphagia[ ]  Heartburn[ ]  Abdominal pain [ ]  Constipation [ ] ; Diarrhea [ ] ; BRBPR [ ]  Melena[ ]  GU: Hematuria[ ]  Dysuria [ ]  Nocturia[ ]  Urgency [ ]   Hesitancy [ ]  Discharge [ ]  Neuro: Headaches[ ]  Vertigo[ ]  Paresthesias[ ]  Spasm [ ]  Speech changes [ ]  Incoordination [ ]   Ortho: Arthritis [ ]  Joint pain [x ] Muscle pain [ ]  Joint swelling [ ]  Back Pain [x ] Skin:  Rash [ ]   Pruritis [ ]  Change in skin lesion [ ]   Psych: Depression[ ]  Anxiety[ ]  Confusion [ ]  Memory loss [ ]   Heme/Lypmh: Bleeding [ ]  Bruising [ ]  Enlarged lymph nodes [ ]   Endocrine: Visual blurring [ ]  Paresthesia [ ]  Polyuria [ ]  Polydypsea [ ]    Heat/cold intolerance [ ]  Hypoglycemia [ ]   Family history- Review and unchanged Social history- Review and unchanged Physical Exam: BP 120/78  Pulse 72  Temp(Src) 98.1 F (36.7 C)  Resp 16  Ht 5\' 3"  (1.6 m)  Wt 171 lb (77.565 kg)  BMI 30.30 kg/m2 Wt Readings from Last 3 Encounters:  09/04/14 171 lb (77.565 kg)  05/29/14 167 lb (75.751 kg)  02/20/14 163 lb (73.936 kg)   General Appearance: Well nourished, in no apparent distress. Eyes: PERRLA, EOMs, conjunctiva no swelling or erythema Sinuses: No Frontal/maxillary tenderness ENT/Mouth: Ext aud canals clear, TMs without erythema, bulging. No erythema, swelling, or exudate on post pharynx.  Tonsils not swollen or erythematous. Hearing normal.  Neck: Supple, thyroid normal.  Respiratory:  Respiratory effort normal, BS equal bilaterally without rales, rhonchi, wheezing or stridor.  Cardio: RRR with no MRGs. Brisk peripheral pulses without edema.  Abdomen: Soft, + BS.  Non tender, no guarding, rebound, hernias, masses. Lymphatics: Non tender without lymphadenopathy.  Musculoskeletal: Full ROM, 5/5 strength, gait antalgic, unsteady Skin: Warm, dry without rashes, lesions, ecchymosis. + ulcers/ callouses bilateral feet, lateral left foot worse.   Neuro: Cranial nerves intact. No cerebellar symptoms. Sensation intact.  Psych: Awake and oriented X 3, normal affect, Insight and Judgment appropriate.  Quentin Mullingollier, Imir Brumbach 5:00 PM

## 2014-09-05 LAB — HEMOGLOBIN A1C
HEMOGLOBIN A1C: 6.9 % — AB (ref <5.7)
Mean Plasma Glucose: 151 mg/dL — ABNORMAL HIGH (ref <117)

## 2014-09-05 LAB — VITAMIN D 25 HYDROXY (VIT D DEFICIENCY, FRACTURES): Vit D, 25-Hydroxy: 78 ng/mL (ref 30–89)

## 2014-09-25 ENCOUNTER — Other Ambulatory Visit: Payer: Self-pay | Admitting: Physician Assistant

## 2014-10-24 IMAGING — CR DG HIP (WITH OR WITHOUT PELVIS) 2-3V*L*
3 series · 3 of 3 positions shown · non-contrast
Comparison: 11/09/2009

CLINICAL DATA: Left hip pain post fall.

EXAM:
LEFT HIP - COMPLETE 2+ VIEW

[t hip frog leg left]
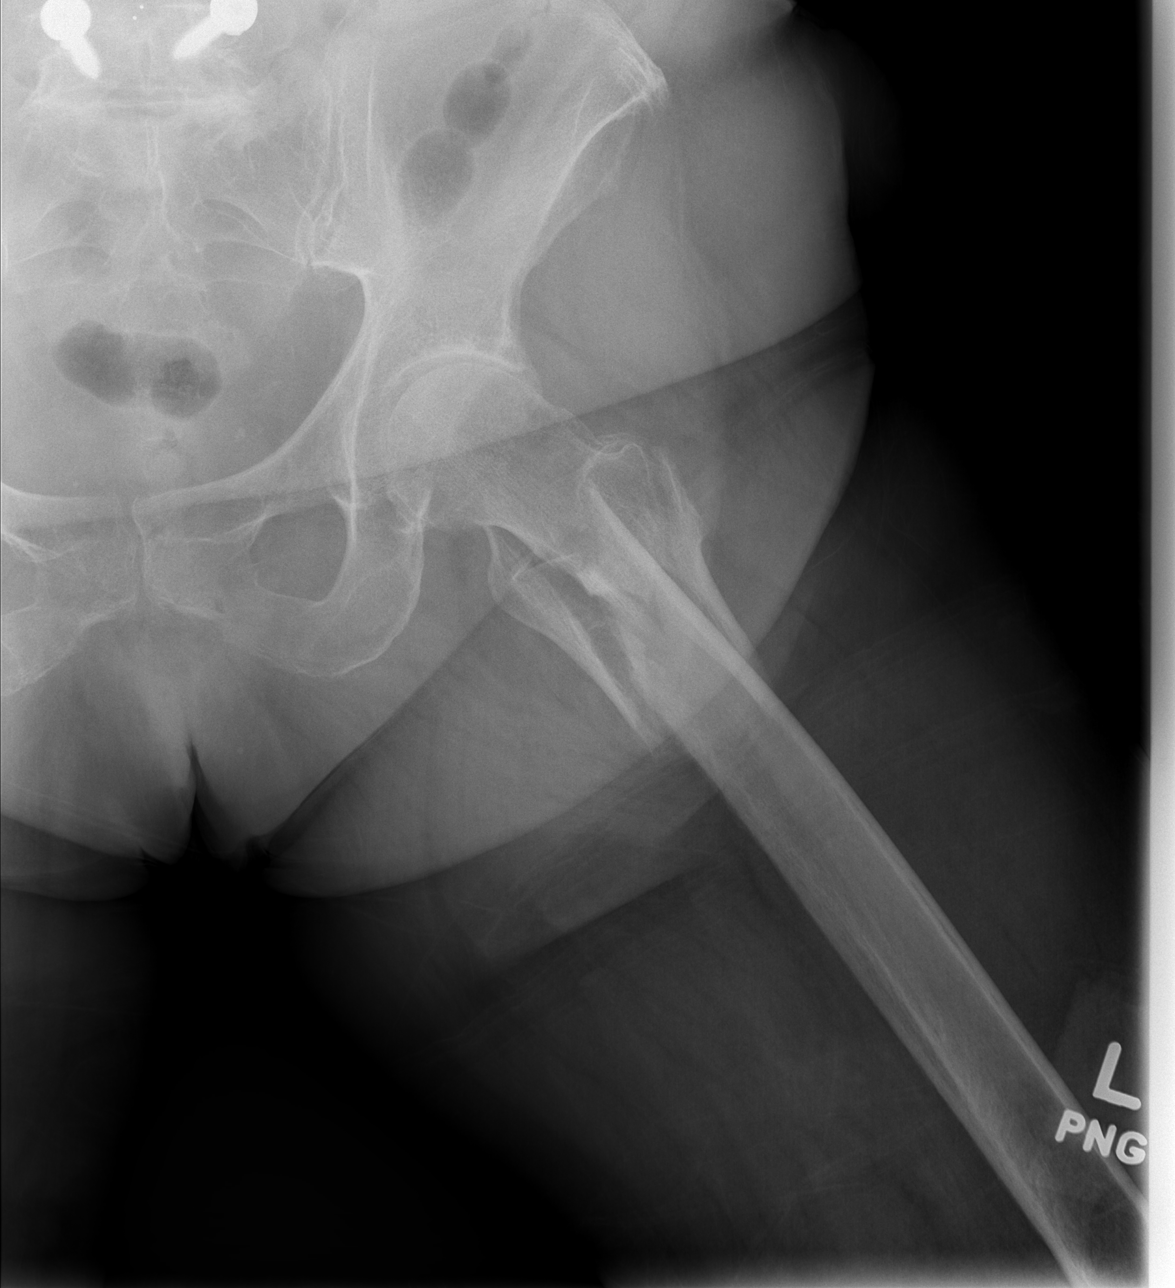

[t pelvis a.p.]
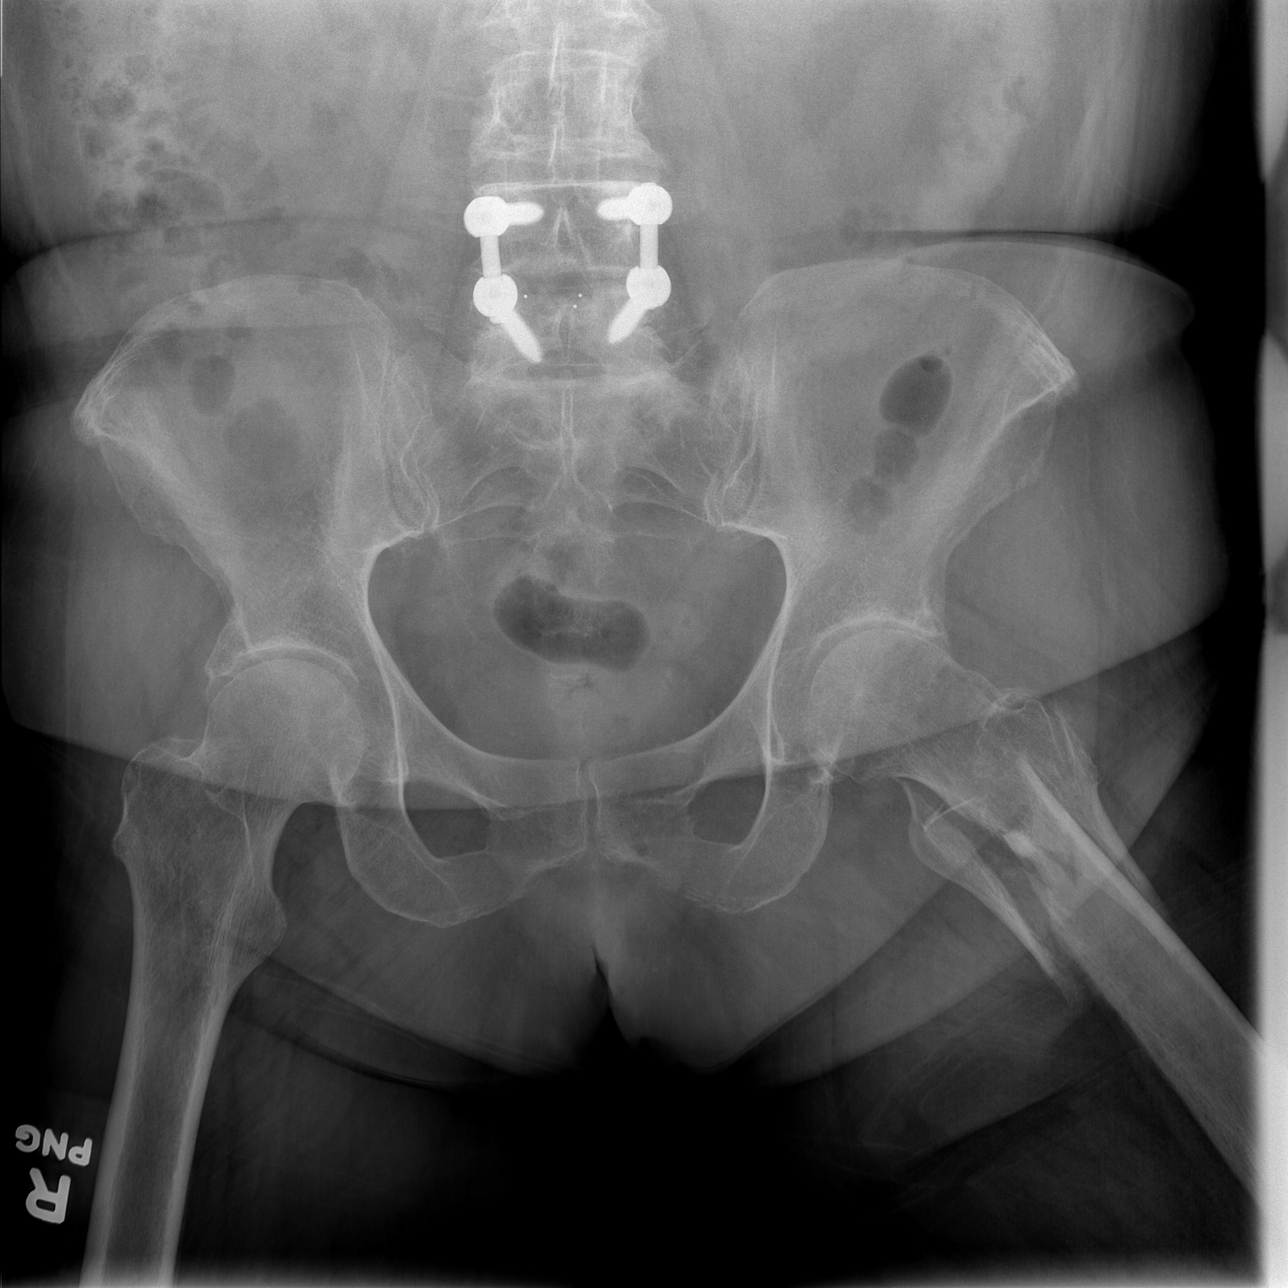

[t hip ap left]
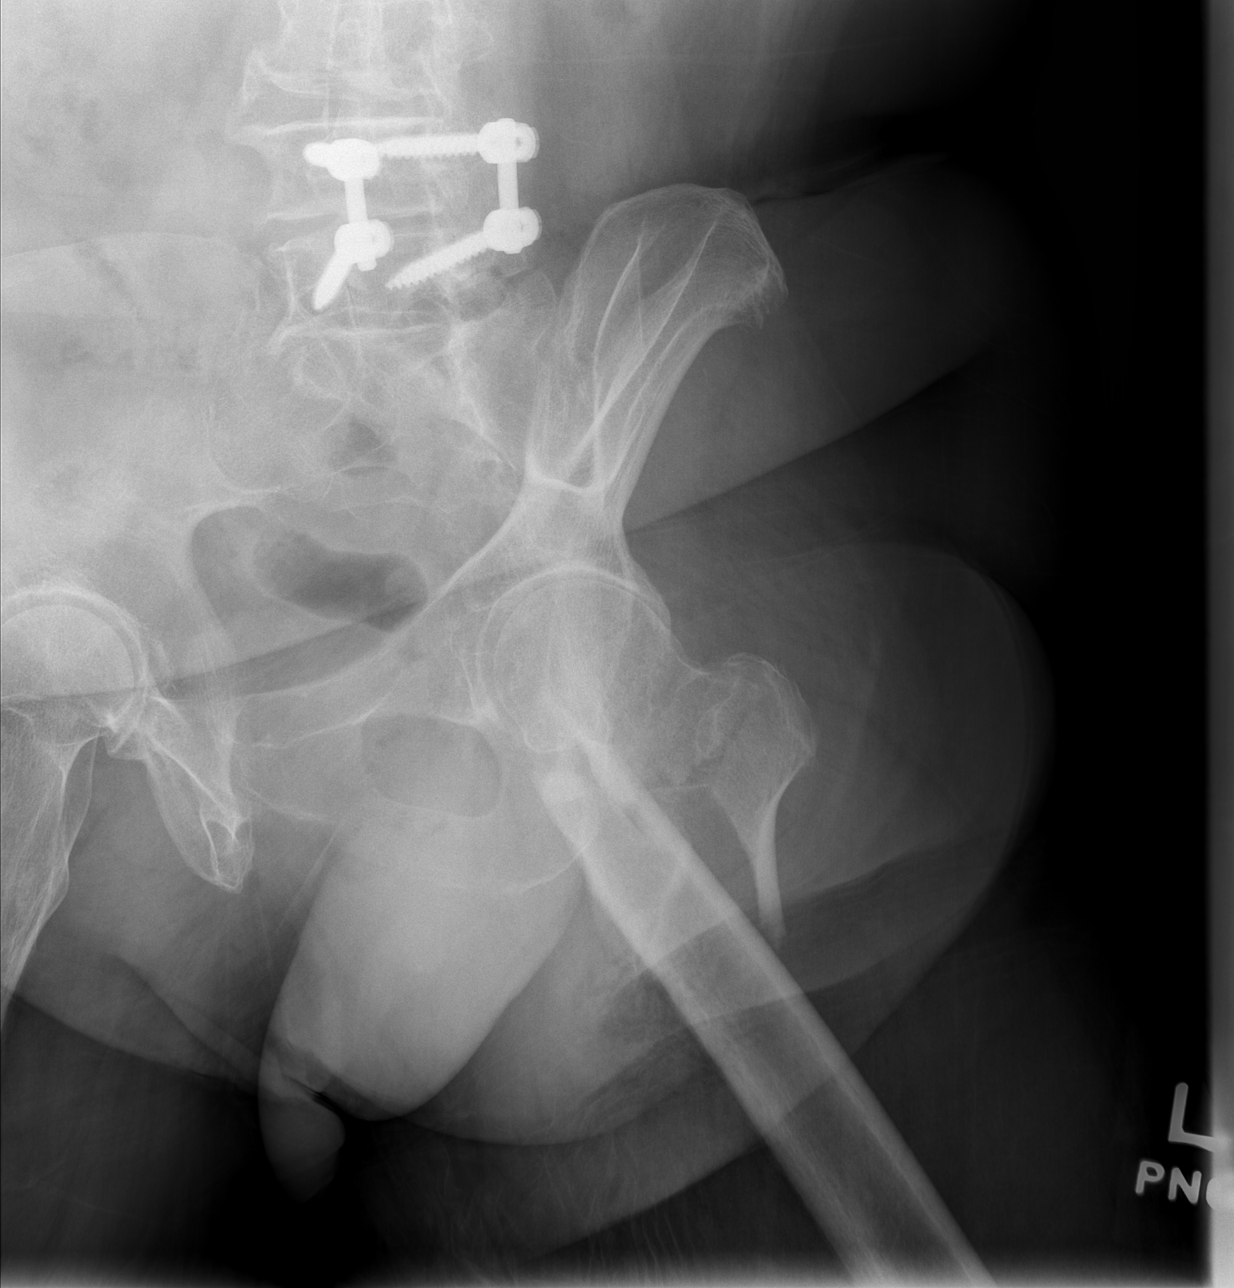

[3 of 3 positions shown; findings below may reference images not displayed]

FINDINGS: Comminuted left intertrochanteric and proximal diaphyseal femur
fracture, with impaction and override of major fracture fragments.
No dislocation or intra-articular involvement of the fracture.
Changes of PLIF L4-5.
IMPRESSION: 1. Comminuted left intertrochanteric and proximal diaphyseal femur
fracture

## 2014-10-25 ENCOUNTER — Other Ambulatory Visit: Payer: Self-pay

## 2014-10-25 IMAGING — RF DG FEMUR 2V*L*
1 series · 4 of 4 positions shown · non-contrast
Comparison: Preoperative radiographs 09/28/2013

CLINICAL DATA: ORIF left intertrochanteric fracture

EXAM:
LEFT FEMUR - 2 VIEW; DG C-ARM 1-60 MIN

[Series 1: run · 4 of 4 slices shown]
[im 1/4]
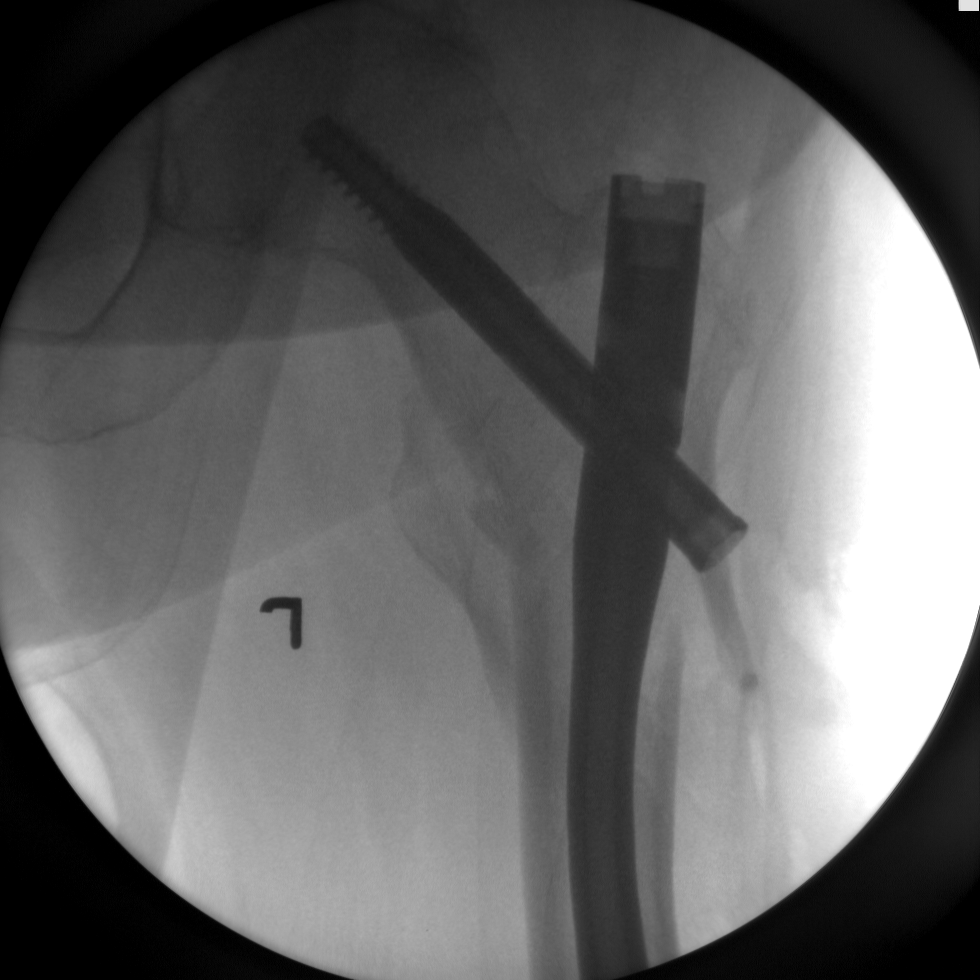
[im 2/4]
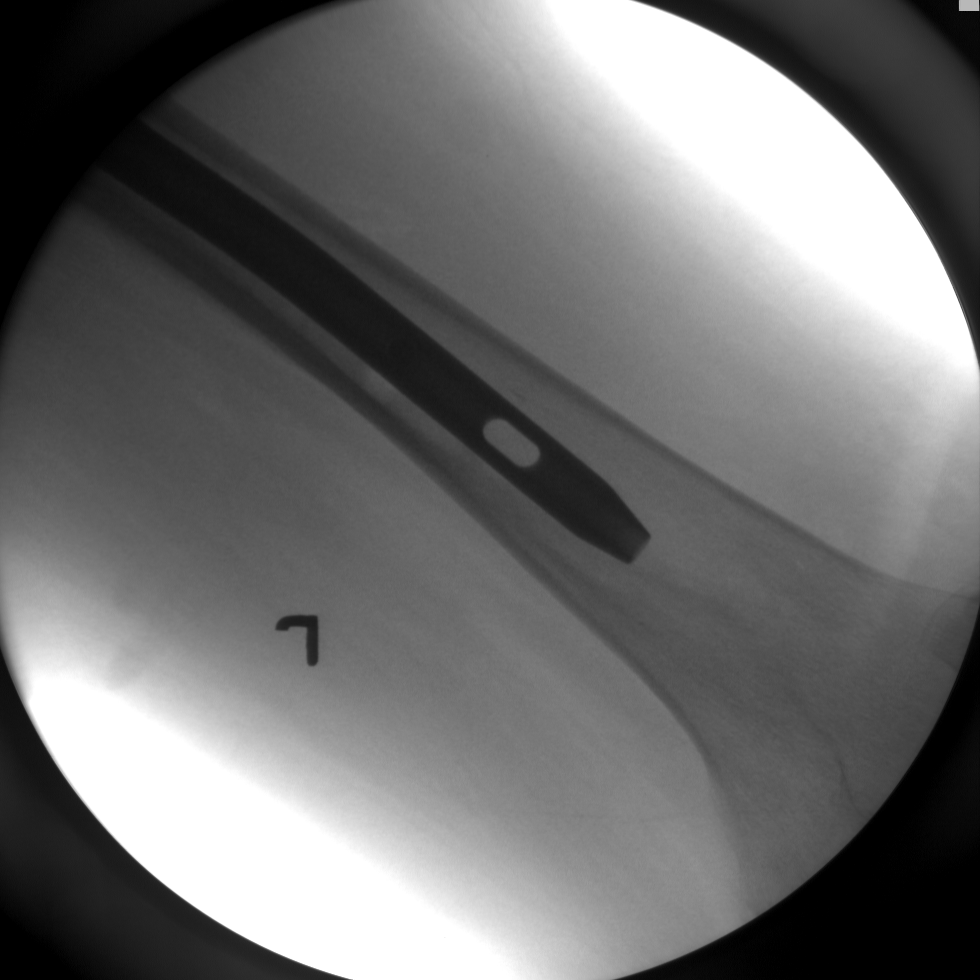
[im 3/4]
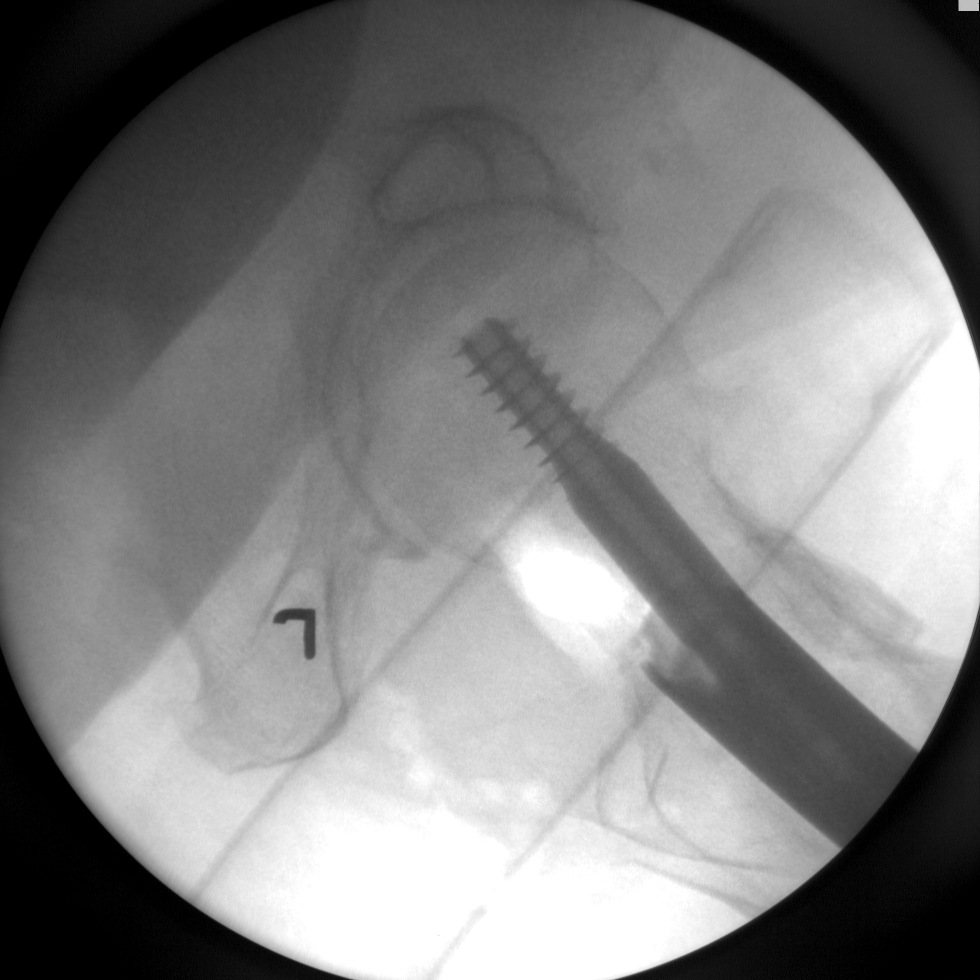
[im 4/4]
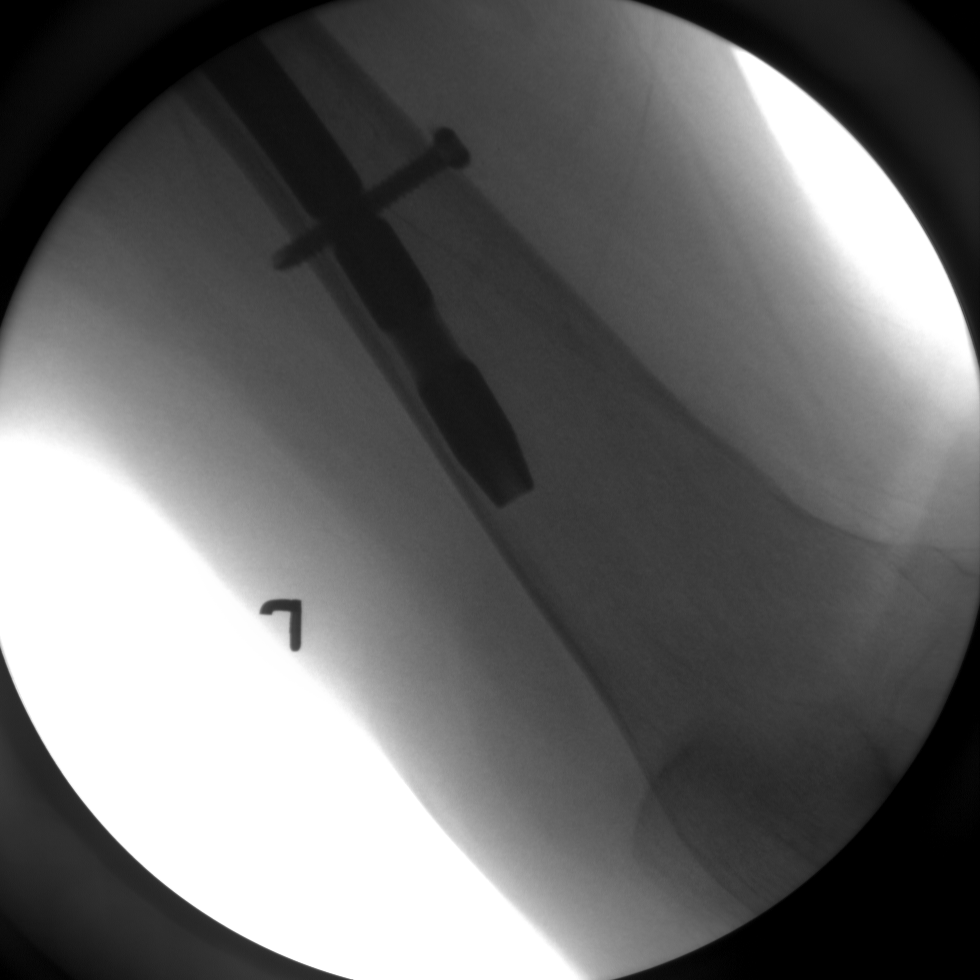

[4 of 4 positions shown; findings below may reference images not displayed]

FINDINGS: A total of 4 intraoperative spot radiographs demonstrate open
reduction and internal fixation of the previously described
comminuted intertrochanteric fracture with and [employer] hip fracture
nail system. Alignment of the fracture fragments is improved
compared to the preoperative radiographs. No evidence of immediate
hardware complication.
IMPRESSION: ORIF of comminuted intertrochanteric fracture without evidence of
immediate hardware complication.

Alignment of the fracture fragments is improved compared to the
preoperative radiographs.

## 2014-10-25 MED ORDER — ROSUVASTATIN CALCIUM 20 MG PO TABS: 20.0000 mg | ORAL_TABLET | Freq: Every day | ORAL | Status: DC

## 2014-10-31 ENCOUNTER — Other Ambulatory Visit: Payer: Self-pay | Admitting: Physician Assistant

## 2014-11-26 ENCOUNTER — Other Ambulatory Visit: Payer: Self-pay | Admitting: Internal Medicine

## 2014-11-26 ENCOUNTER — Other Ambulatory Visit: Payer: Self-pay | Admitting: Physician Assistant

## 2014-11-26 MED ORDER — METFORMIN HCL ER 500 MG PO TB24: ORAL_TABLET | ORAL | Status: DC

## 2014-12-11 ENCOUNTER — Other Ambulatory Visit: Payer: Self-pay | Admitting: Internal Medicine

## 2014-12-11 ENCOUNTER — Ambulatory Visit (INDEPENDENT_AMBULATORY_CARE_PROVIDER_SITE_OTHER): Payer: Medicare Other | Admitting: Internal Medicine

## 2014-12-11 ENCOUNTER — Encounter: Payer: Self-pay | Admitting: Internal Medicine

## 2014-12-11 VITALS — BP 116/78 | HR 80 | Temp 97.9°F | Resp 16 | Ht 63.0 in | Wt 179.6 lb

## 2014-12-11 DIAGNOSIS — I10 Essential (primary) hypertension: Secondary | ICD-10-CM

## 2014-12-11 DIAGNOSIS — E1122 Type 2 diabetes mellitus with diabetic chronic kidney disease: Secondary | ICD-10-CM

## 2014-12-11 DIAGNOSIS — E785 Hyperlipidemia, unspecified: Secondary | ICD-10-CM

## 2014-12-11 DIAGNOSIS — N182 Chronic kidney disease, stage 2 (mild): Secondary | ICD-10-CM

## 2014-12-11 DIAGNOSIS — E559 Vitamin D deficiency, unspecified: Secondary | ICD-10-CM

## 2014-12-11 DIAGNOSIS — Z79899 Other long term (current) drug therapy: Secondary | ICD-10-CM | POA: Insufficient documentation

## 2014-12-11 LAB — CBC WITH DIFFERENTIAL/PLATELET
BASOS ABS: 0 10*3/uL (ref 0.0–0.1)
Basophils Relative: 0 % (ref 0–1)
EOS PCT: 4 % (ref 0–5)
Eosinophils Absolute: 0.3 10*3/uL (ref 0.0–0.7)
HCT: 36.9 % (ref 36.0–46.0)
HEMOGLOBIN: 11.6 g/dL — AB (ref 12.0–15.0)
Lymphocytes Relative: 31 % (ref 12–46)
Lymphs Abs: 2.1 10*3/uL (ref 0.7–4.0)
MCH: 26.8 pg (ref 26.0–34.0)
MCHC: 31.4 g/dL (ref 30.0–36.0)
MCV: 85.2 fL (ref 78.0–100.0)
MONOS PCT: 8 % (ref 3–12)
MPV: 10.4 fL (ref 8.6–12.4)
Monocytes Absolute: 0.6 10*3/uL (ref 0.1–1.0)
Neutro Abs: 3.9 10*3/uL (ref 1.7–7.7)
Neutrophils Relative %: 57 % (ref 43–77)
Platelets: 300 10*3/uL (ref 150–400)
RBC: 4.33 MIL/uL (ref 3.87–5.11)
RDW: 14.3 % (ref 11.5–15.5)
WBC: 6.9 10*3/uL (ref 4.0–10.5)

## 2014-12-11 NOTE — Patient Instructions (Signed)

## 2014-12-11 NOTE — Progress Notes (Signed)
Patient ID: [name], female   DOB: 30-Jan-1951, 64 y.o.   MRN: [id number]   This very nice 64 y.o.female presents for 3 month follow up with Hypertension, Hyperlipidemia, Pre-Diabetes and Vitamin D Deficiency.    Patient is treated for HTN & BP has been controlled at home. Today's BP: 116/78 mmHg. Patient has had no complaints of any cardiac type chest pain, palpitations, dyspnea/orthopnea/PND, dizziness, claudication, or dependent edema.   Hyperlipidemia is controlled with diet & meds. Patient denies myalgias or other med SE's. Last Lipids were at goal - Total Chol 114; HDL 47; LDL 32; Trig 175 on 09/04/2014.   Also, the patient has history of T2_NIDDM w/CKD2  and has had no symptoms of reactive hypoglycemia, diabetic polys, paresthesias or visual blurring.  Last A1c was  6.9% on  09/04/2014.   Further, the patient also has history of Vitamin D Deficiency and supplements vitamin D without any suspected side-effects. Last vitamin D was  78 on  09/04/2014.   Medication List   aspirin EC 325 MG tablet  Take 325 mg by mouth once.     BOOSTRIX 5-2.5-18.5 LF-MCG/0.5 injection  Generic drug:  Tdap     citalopram 40 MG tablet  Commonly known as:  CELEXA  TAKE 1 TABLET (40 MG TOTAL) BY MOUTH DAILY.     Magnesium 250 MG Tabs  Take 250 mg by mouth daily.     metFORMIN 500 MG 24 hr tablet  Commonly known as:  GLUCOPHAGE-XR  Take 1 to 2 tablets 2 x day with food for Diabetes     MULTIVITAMIN PO  Take by mouth daily.     quinapril-hydrochlorothiazide 20-12.5 MG per tablet  Commonly known as:  ACCURETIC  TAKE 2 TABLETS BY MOUTH EVERY DAY FOR BLOOD PRESSURE     rosuvastatin 20 MG tablet  Commonly known as:  CRESTOR  Take 1 tablet (20 mg total) by mouth daily.     VITAMIN D PO  Take 2,000 Units by mouth 3 (three) times daily.     Allergies  Allergen Reactions  . Other Other (See Comments)    Unknown anesthetic; unknown reaction; Anesthetic was given during back surgery   PMHx:    Past Medical History  Diagnosis Date  . Hypertension   . Hyperlipidemia   . Fibromyalgia   . Back pain   . Diabetes mellitus without complication   . Type II or unspecified type diabetes mellitus without mention of complication, not stated as uncontrolled   . History of kidney stones   . Vitamin D deficiency    Immunization History  Administered Date(s) Administered  . Influenza Split 08/17/2013, 09/04/2014  . Pneumococcal Conjugate-13 05/29/2014  . Td 05/06/2006  . Tdap 09/04/2014   Past Surgical History  Procedure Laterality Date  . Intramedullary (im) nail intertrochanteric Left 09/29/2013    Procedure: INTRAMEDULLARY (IM) NAIL INTERTROCHANTRIC;  Surgeon: Mable ParisJustin Dealva Lafoy Chandler, MD;  [facility];  Service: Orthopedics;  Laterality: Left;   FHx:    Reviewed / unchanged  SHx:    Reviewed / unchanged  Systems Review:  Constitutional: Denies fever, chills, wt changes, headaches, insomnia, fatigue, night sweats, change in appetite. Eyes: Denies redness, blurred vision, diplopia, discharge, itchy, watery eyes.  ENT: Denies discharge, congestion, post nasal drip, epistaxis, sore throat, earache, hearing loss, dental pain, tinnitus, vertigo, sinus pain, snoring.  CV: Denies chest pain, palpitations, irregular heartbeat, syncope, dyspnea, diaphoresis, orthopnea, PND, claudication or edema. Respiratory: denies cough, dyspnea, DOE, pleurisy, hoarseness, laryngitis, wheezing.  Gastrointestinal: Denies dysphagia, odynophagia, heartburn, reflux, water brash, abdominal pain or cramps, nausea, vomiting, bloating, diarrhea, constipation, hematemesis, melena, hematochezia  or hemorrhoids. Genitourinary: Denies dysuria, frequency, urgency, nocturia, hesitancy, discharge, hematuria or flank pain. Musculoskeletal: Denies arthralgias, myalgias, stiffness, jt. swelling, pain, limping or strain/sprain.  Skin: Denies pruritus, rash, hives, warts, acne, eczema or change in skin lesion(s). Neuro:  No weakness, tremor, incoordination, spasms, paresthesia or pain. Psychiatric: Denies confusion, memory loss or sensory loss. Endo: Denies change in weight, skin or hair change.  Heme/Lymph: No excessive bleeding, bruising or enlarged lymph nodes.  Physical Exam  BP 116/78   Pulse 80  Temp 97.9 F   Resp 16  Ht    Wt 179 lb 9.6 oz    BMI 31.82  Appears well nourished and in no distress. Eyes: PERRLA, EOMs, conjunctiva no swelling or erythema. Sinuses: No frontal/maxillary tenderness ENT/Mouth: EAC's clear, TM's nl w/o erythema, bulging. Nares clear w/o erythema, swelling, exudates. Oropharynx clear without erythema or exudates. Oral hygiene is good. Tongue normal, non obstructing. Hearing intact.  Neck: Supple. Thyroid nl. Car 2+/2+ without bruits, nodes or JVD. Chest: Respirations nl with BS clear & equal w/o rales, rhonchi, wheezing or stridor.  Cor: Heart sounds normal w/ regular rate and rhythm without sig. murmurs, gallops, clicks, or rubs. Peripheral pulses normal and equal  without edema.  Abdomen: Soft & bowel sounds normal. Non-tender w/o guarding, rebound, hernias, masses, or organomegaly.  Lymphatics: Unremarkable.  Musculoskeletal: Full ROM all peripheral extremities, joint stability, 5/5 strength, and normal gait.  Skin: Warm, dry without exposed rashes, lesions or ecchymosis apparent.  Neuro: Cranial nerves intact, reflexes equal bilaterally. Sensory-motor testing grossly intact. Tendon reflexes grossly intact.  Pysch: Alert & oriented x 3.  Insight and judgement nl & appropriate. No ideations.  Assessment and Plan:  1. Hypertension - Continue monitor blood pressure at home. Continue diet/meds same.  2. Hyperlipidemia - Continue diet/meds, exercise,& lifestyle modifications. Continue monitor periodic cholesterol/liver & renal functions   3. T2_NIDDM w/CKD2 - Continue diet, exercise, lifestyle modifications. Monitor appropriate labs.  4. Vitamin D Deficiency -  Continue supplementation.   Recommended regular exercise, BP monitoring, weight control, and discussed med and SE's. Recommended labs to assess and monitor clinical status. Further disposition pending results of labs.

## 2014-12-12 LAB — VITAMIN D 25 HYDROXY (VIT D DEFICIENCY, FRACTURES): Vit D, 25-Hydroxy: 60 ng/mL (ref 30–100)

## 2014-12-12 LAB — BASIC METABOLIC PANEL WITH GFR
BUN: 17 mg/dL (ref 6–23)
CHLORIDE: 102 meq/L (ref 96–112)
CO2: 31 mEq/L (ref 19–32)
CREATININE: 0.92 mg/dL (ref 0.50–1.10)
Calcium: 10.9 mg/dL — ABNORMAL HIGH (ref 8.4–10.5)
GFR, EST AFRICAN AMERICAN: 76 mL/min
GFR, Est Non African American: 66 mL/min
Glucose, Bld: 150 mg/dL — ABNORMAL HIGH (ref 70–99)
POTASSIUM: 4.3 meq/L (ref 3.5–5.3)
Sodium: 143 mEq/L (ref 135–145)

## 2014-12-12 LAB — HEPATIC FUNCTION PANEL
ALT: 19 U/L (ref 0–35)
AST: 23 U/L (ref 0–37)
Albumin: 3.9 g/dL (ref 3.5–5.2)
Alkaline Phosphatase: 78 U/L (ref 39–117)
BILIRUBIN INDIRECT: 0.3 mg/dL (ref 0.2–1.2)
Bilirubin, Direct: 0.1 mg/dL (ref 0.0–0.3)
Total Bilirubin: 0.4 mg/dL (ref 0.2–1.2)
Total Protein: 6.5 g/dL (ref 6.0–8.3)

## 2014-12-12 LAB — LIPID PANEL
Cholesterol: 127 mg/dL (ref 0–200)
HDL: 48 mg/dL (ref >39)
LDL Cholesterol: 33 mg/dL (ref 0–99)
TRIGLYCERIDES: 232 mg/dL — AB (ref <150)
Total CHOL/HDL Ratio: 2.6 Ratio
VLDL: 46 mg/dL — AB (ref 0–40)

## 2014-12-12 LAB — MAGNESIUM: Magnesium: 1.7 mg/dL (ref 1.5–2.5)

## 2014-12-12 LAB — HEMOGLOBIN A1C
Hgb A1c MFr Bld: 6.9 % — ABNORMAL HIGH (ref <5.7)
MEAN PLASMA GLUCOSE: 151 mg/dL — AB (ref <117)

## 2014-12-12 LAB — TSH: TSH: 1.021 u[IU]/mL (ref 0.350–4.500)

## 2014-12-12 LAB — INSULIN, FASTING: Insulin fasting, serum: 47.9 u[IU]/mL — ABNORMAL HIGH (ref 2.0–19.6)

## 2014-12-18 ENCOUNTER — Other Ambulatory Visit: Payer: Self-pay | Admitting: Physician Assistant

## 2015-02-01 ENCOUNTER — Other Ambulatory Visit: Payer: Self-pay | Admitting: *Deleted

## 2015-02-01 ENCOUNTER — Other Ambulatory Visit: Payer: Self-pay | Admitting: Physician Assistant

## 2015-02-01 MED ORDER — ALPRAZOLAM 0.5 MG PO TABS: ORAL_TABLET | ORAL | Status: DC

## 2015-02-07 ENCOUNTER — Other Ambulatory Visit: Payer: Self-pay | Admitting: Internal Medicine

## 2015-02-27 ENCOUNTER — Other Ambulatory Visit: Payer: Self-pay | Admitting: Physician Assistant

## 2015-03-01 ENCOUNTER — Other Ambulatory Visit: Payer: Self-pay | Admitting: Internal Medicine

## 2015-03-04 ENCOUNTER — Other Ambulatory Visit: Payer: Self-pay | Admitting: Internal Medicine

## 2015-03-04 DIAGNOSIS — F419 Anxiety disorder, unspecified: Secondary | ICD-10-CM

## 2015-03-05 MED ORDER — ALPRAZOLAM 0.5 MG PO TABS: ORAL_TABLET | ORAL | Status: DC

## 2015-04-02 ENCOUNTER — Encounter: Payer: Self-pay | Admitting: Physician Assistant

## 2015-04-02 ENCOUNTER — Ambulatory Visit (INDEPENDENT_AMBULATORY_CARE_PROVIDER_SITE_OTHER): Payer: Medicare Other | Admitting: Physician Assistant

## 2015-04-02 VITALS — BP 128/78 | HR 60 | Temp 97.7°F | Resp 16 | Ht 63.0 in | Wt 186.0 lb

## 2015-04-02 DIAGNOSIS — F32A Depression, unspecified: Secondary | ICD-10-CM

## 2015-04-02 DIAGNOSIS — E669 Obesity, unspecified: Secondary | ICD-10-CM

## 2015-04-02 DIAGNOSIS — E559 Vitamin D deficiency, unspecified: Secondary | ICD-10-CM

## 2015-04-02 DIAGNOSIS — E1122 Type 2 diabetes mellitus with diabetic chronic kidney disease: Secondary | ICD-10-CM

## 2015-04-02 DIAGNOSIS — Z0001 Encounter for general adult medical examination with abnormal findings: Secondary | ICD-10-CM

## 2015-04-02 DIAGNOSIS — R002 Palpitations: Secondary | ICD-10-CM

## 2015-04-02 DIAGNOSIS — R4781 Slurred speech: Secondary | ICD-10-CM

## 2015-04-02 DIAGNOSIS — E113599 Type 2 diabetes mellitus with proliferative diabetic retinopathy without macular edema, unspecified eye: Secondary | ICD-10-CM

## 2015-04-02 DIAGNOSIS — Z87442 Personal history of urinary calculi: Secondary | ICD-10-CM

## 2015-04-02 DIAGNOSIS — R6889 Other general symptoms and signs: Secondary | ICD-10-CM

## 2015-04-02 DIAGNOSIS — N182 Chronic kidney disease, stage 2 (mild): Secondary | ICD-10-CM

## 2015-04-02 DIAGNOSIS — R05 Cough: Secondary | ICD-10-CM

## 2015-04-02 DIAGNOSIS — E11319 Type 2 diabetes mellitus with unspecified diabetic retinopathy without macular edema: Secondary | ICD-10-CM | POA: Insufficient documentation

## 2015-04-02 DIAGNOSIS — E785 Hyperlipidemia, unspecified: Secondary | ICD-10-CM

## 2015-04-02 DIAGNOSIS — F329 Major depressive disorder, single episode, unspecified: Secondary | ICD-10-CM

## 2015-04-02 DIAGNOSIS — Z79899 Other long term (current) drug therapy: Secondary | ICD-10-CM

## 2015-04-02 DIAGNOSIS — M545 Low back pain: Secondary | ICD-10-CM

## 2015-04-02 DIAGNOSIS — Z9181 History of falling: Secondary | ICD-10-CM

## 2015-04-02 DIAGNOSIS — R059 Cough, unspecified: Secondary | ICD-10-CM

## 2015-04-02 DIAGNOSIS — I10 Essential (primary) hypertension: Secondary | ICD-10-CM

## 2015-04-02 DIAGNOSIS — R0683 Snoring: Secondary | ICD-10-CM

## 2015-04-02 LAB — BASIC METABOLIC PANEL WITH GFR
BUN: 15 mg/dL (ref 6–23)
CALCIUM: 10.5 mg/dL (ref 8.4–10.5)
CHLORIDE: 103 meq/L (ref 96–112)
CO2: 29 meq/L (ref 19–32)
Creat: 0.96 mg/dL (ref 0.50–1.10)
GFR, EST AFRICAN AMERICAN: 72 mL/min
GFR, Est Non African American: 62 mL/min
GLUCOSE: 108 mg/dL — AB (ref 70–99)
Potassium: 4.5 mEq/L (ref 3.5–5.3)
Sodium: 143 mEq/L (ref 135–145)

## 2015-04-02 LAB — CBC WITH DIFFERENTIAL/PLATELET
Basophils Absolute: 0.1 10*3/uL (ref 0.0–0.1)
Basophils Relative: 1 % (ref 0–1)
EOS ABS: 0.1 10*3/uL (ref 0.0–0.7)
Eosinophils Relative: 2 % (ref 0–5)
HCT: 36.7 % (ref 36.0–46.0)
Hemoglobin: 11.9 g/dL — ABNORMAL LOW (ref 12.0–15.0)
Lymphocytes Relative: 39 % (ref 12–46)
Lymphs Abs: 2.3 10*3/uL (ref 0.7–4.0)
MCH: 26.9 pg (ref 26.0–34.0)
MCHC: 32.4 g/dL (ref 30.0–36.0)
MCV: 83 fL (ref 78.0–100.0)
MONO ABS: 0.5 10*3/uL (ref 0.1–1.0)
MPV: 10.6 fL (ref 8.6–12.4)
Monocytes Relative: 9 % (ref 3–12)
NEUTROS ABS: 2.9 10*3/uL (ref 1.7–7.7)
Neutrophils Relative %: 49 % (ref 43–77)
PLATELETS: 289 10*3/uL (ref 150–400)
RBC: 4.42 MIL/uL (ref 3.87–5.11)
RDW: 15.4 % (ref 11.5–15.5)
WBC: 5.9 10*3/uL (ref 4.0–10.5)

## 2015-04-02 LAB — LIPID PANEL
CHOL/HDL RATIO: 2.9 ratio
CHOLESTEROL: 133 mg/dL (ref 0–200)
HDL: 46 mg/dL (ref >=46)
LDL CALC: 41 mg/dL (ref 0–99)
Triglycerides: 228 mg/dL — ABNORMAL HIGH (ref <150)
VLDL: 46 mg/dL — AB (ref 0–40)

## 2015-04-02 LAB — HEPATIC FUNCTION PANEL
ALT: 15 U/L (ref 0–35)
AST: 20 U/L (ref 0–37)
Albumin: 4 g/dL (ref 3.5–5.2)
Alkaline Phosphatase: 81 U/L (ref 39–117)
BILIRUBIN DIRECT: 0.1 mg/dL (ref 0.0–0.3)
Indirect Bilirubin: 0.4 mg/dL (ref 0.2–1.2)
Total Bilirubin: 0.5 mg/dL (ref 0.2–1.2)
Total Protein: 6.6 g/dL (ref 6.0–8.3)

## 2015-04-02 LAB — MAGNESIUM: MAGNESIUM: 1.8 mg/dL (ref 1.5–2.5)

## 2015-04-02 NOTE — Patient Instructions (Signed)
We have made a referral for you to get imaging or go to another doctor. We will try to get this as soon as possible but please understand that we often need to get approval with your insurance before we can schedule and not every office can accommodate you quickly. So please allow 5-7 business days for us to call you back about the referral.   I have sent you to PT for your balance and cardiology for a holter monitor and possible echo.  if worsening HA, changes vision/speech, imbalance, weakness go to the ER     Bad carbs also include fruit juice, alcohol, and sweet tea. These are empty calories that do not signal to your brain that you are full.   Please remember the good carbs are still carbs which convert into sugar. So please measure them out no more than 1/2-1 cup of rice, oatmeal, pasta, and beans  Veggies are however free foods! Pile them on.   Not all fruit is created equal. Please see the list below, the fruit at the bottom is higher in sugars than the fruit at the top. Please avoid all dried fruits.     Recommendations For Diabetic/Prediabetic Patients:   -  Take medications as prescribed  -  Recommend Dr Francis DowseJoel Fuhrman's book "The End of Diabetes "  And "The End of Dieting"- Can get at  www.Amazon.com and encourage also get the Audio CD book  - AVOID Animal products, ie. Meat - red/white, Poultry and Dairy/especially cheese - Exercise at least 5 times a week for 30 minutes or preferably daily.  - No Smoking - Drink less than 2 drinks a day.  - Monitor your feet for sores - Have yearly Eye Exams - Recommend annual Flu vaccine  - Recommend Pneumovax and Prevnar vaccines - Shingles Vaccine (Zostavax) if over 66 y.o.  Goals:   - BMI less than 24 - Fasting sugar less than 130 or less than 150 if tapering medicines to lose weight  - Systolic BP less than 130  - Diastolic BP less than 80 - Bad LDL Cholesterol less than 70 - Triglycerides less than 150   I think it is  possible that you have sleep apnea. It can cause interrupted sleep, headaches, frequent awakenings, fatigue, dry mouth, fast/slow heart beats, memory issues, anxiety/depression, swelling, numbness tingling hands/feet, weight gain, shortness of breath, and the list goes on. Sleep apnea needs to be ruled out because if it is left untreated it does eventually lead to abnormal heart beats, lung failure or heart failure as well as increasing the risk of heart attack and stroke. There are masks you can wear OR a mouth piece that I can give you information about. Often times though people feel MUCH better after getting treatment.   Sleep Apnea  Sleep apnea is a sleep disorder characterized by abnormal pauses in breathing while you sleep. When your breathing pauses, the level of oxygen in your blood decreases. This causes you to move out of deep sleep and into light sleep. As a result, your quality of sleep is poor, and the system that carries your blood throughout your body (cardiovascular system) experiences stress. If sleep apnea remains untreated, the following conditions can develop:  High blood pressure (hypertension).  Coronary artery disease.  Inability to achieve or maintain an erection (impotence).  Impairment of your thought process (cognitive dysfunction). There are three types of sleep apnea: 1. Obstructive sleep apnea--Pauses in breathing during sleep because of a blocked airway.  2. Central sleep apnea--Pauses in breathing during sleep because the area of the brain that controls your breathing does not send the correct signals to the muscles that control breathing. 3. Mixed sleep apnea--A combination of both obstructive and central sleep apnea.  RISK FACTORS The following risk factors can increase your risk of developing sleep apnea:  Being overweight.  Smoking.  Having narrow passages in your nose and throat.  Being of older age.  Being female.  Alcohol use.  Sedative and  tranquilizer use.  Ethnicity. Among individuals younger than 35 years, African Americans are at increased risk of sleep apnea. SYMPTOMS   Difficulty staying asleep.  Daytime sleepiness and fatigue.  Loss of energy.  Irritability.  Loud, heavy snoring.  Morning headaches.  Trouble concentrating.  Forgetfulness.  Decreased interest in sex. DIAGNOSIS  In order to diagnose sleep apnea, your caregiver will perform a physical examination. Your caregiver may suggest that you take a home sleep test. Your caregiver may also recommend that you spend the night in a sleep lab. In the sleep lab, several monitors record information about your heart, lungs, and brain while you sleep. Your leg and arm movements and blood oxygen level are also recorded. TREATMENT The following actions may help to resolve mild sleep apnea:  Sleeping on your side.   Using a decongestant if you have nasal congestion.   Avoiding the use of depressants, including alcohol, sedatives, and narcotics.   Losing weight and modifying your diet if you are overweight. There also are devices and treatments to help open your airway:  Oral appliances. These are custom-made mouthpieces that shift your lower jaw forward and slightly open your bite. This opens your airway.  Devices that create positive airway pressure. This positive pressure "splints" your airway open to help you breathe better during sleep. The following devices create positive airway pressure:  Continuous positive airway pressure (CPAP) device. The CPAP device creates a continuous level of air pressure with an air pump. The air is delivered to your airway through a mask while you sleep. This continuous pressure keeps your airway open.  Nasal expiratory positive airway pressure (EPAP) device. The EPAP device creates positive air pressure as you exhale. The device consists of single-use valves, which are inserted into each nostril and held in place by  adhesive. The valves create very little resistance when you inhale but create much more resistance when you exhale. That increased resistance creates the positive airway pressure. This positive pressure while you exhale keeps your airway open, making it easier to breath when you inhale again.  Bilevel positive airway pressure (BPAP) device. The BPAP device is used mainly in patients with central sleep apnea. This device is similar to the CPAP device because it also uses an air pump to deliver continuous air pressure through a mask. However, with the BPAP machine, the pressure is set at two different levels. The pressure when you exhale is lower than the pressure when you inhale.  Surgery. Typically, surgery is only done if you cannot comply with less invasive treatments or if the less invasive treatments do not improve your condition. Surgery involves removing excess tissue in your airway to create a wider passage way. Document Released: 11/07/2002 Document Revised: 03/14/2013 Document Reviewed: 03/25/2012 [facility] Patient Information 2015 [place], [place]. This information is not intended to replace advice given to you by your health care provider. Make sure you discuss any questions you have with your health care provider.

## 2015-04-02 NOTE — Progress Notes (Signed)
MEDICARE ANNUAL WELLNESS VISIT AND FOLLOW UP  Assessment:   1. Essential hypertension - continue medications, DASH diet, exercise and monitor at home. Call if greater than 130/80. - CBC with Differential/Platelet - BASIC METABOLIC PANEL WITH GFR - Hepatic function panel - TSH  2. CKD stage 2 due to type 2 diabetes mellitus Discussed general issues about diabetes pathophysiology and management., Educational material distributed., Suggested low cholesterol diet., Encouraged aerobic exercise., Discussed foot care., Reminded to get yearly retinal exam. - Hemoglobin A1c - HM DIABETES FOOT EXAM  3. Hyperlipidemia -continue medications, check lipids, decrease fatty foods, increase activity. - Lipid panel  4. Depression + depression, continue celexa/xanax, declines addition of wellbutrin/counseling, and denies SI/HI will monitor  5. Low back pain, unspecified back pain laterality, with sciatica presence unspecified Will refer to PT  6. History of kidney stones Monitor, increase fluids.   7. Vitamin D deficiency - Vit D  25 hydroxy (rtn osteoporosis monitoring)  8. Medication management - Magnesium  9. 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  10. At high risk for falls Due to leg discrepancy, will send to PT with lifts for balance.  - Ambulatory referral to Physical Therapy  11. Obesity Obesity with co morbidities- long discussion about weight loss, diet, and exercise  12. Snoring Crowded mouth, + snoring, obesity, HTN, will occ wake self up but declines study at this time  13. Slurred speech Possible TIA versus anxiety/depression- continue 325 ASA and atenolol will send to cardio for holter  and echo, and stress test if felt needed if worsening HA, changes vision/speech, imbalance, weakness go to the ER - Ambulatory referral to Cardiology  14. Palpitations Possible TIA versus anxiety/depression- continue 325 ASA and atenolol will send to cardio for holter and echo, and stress test if felt needed - Ambulatory referral to Cardiology  15. Cough ? From ACE, lungs CTAB, will give samples of benicar and if better will send in genergic diovan.   Over 30 minutes of exam, counseling, chart review, and critical decision making was performed  Plan:   During the course of the visit the patient was educated and counseled about appropriate screening and preventive services including:    Pneumococcal vaccine   Influenza vaccine  Td vaccine  Screening electrocardiogram  Screening mammography  Bone densitometry screening  Colorectal cancer screening  Diabetes screening  Glaucoma screening  Nutrition counseling   Advanced directives: given info/requested  Conditions/risks identified: Diabetes is at goal, ACE/ARB therapy: Yes. Urinary Incontinence is not an issue: discussed non pharmacology and pharmacology options.  Fall risk: moderate- discussed PT, home fall assessment, medications.    Subjective:   [name] is a 64 y.o. female who presents for Medicare Annual Wellness Visit and 3 month follow up on hypertension, diabetes, hyperlipidemia, vitamin D def.  Date of last medicare wellness visit was 05/30/2015  Her blood pressure has been controlled at home, today their BP is BP: 128/78 mmHg She does not workout but walks dog occ. She denies chest pain, shortness of breath, dizziness.  She is on cholesterol medication, crestor 20mg  and denies myalgias. Her cholesterol is at goal. The cholesterol last visit was:   Lab Results  Component Value Date   CHOL 127 12/11/2014   HDL 48 12/11/2014   LDLCALC 33 12/11/2014   TRIG  232* 12/11/2014   CHOLHDL 2.6 12/11/2014    She has been working on diet and exercise for diabetes with CKD stage II, she is on ACE, she is on ASA, she is on metformin, she does not check her sugars, and denies paresthesia of the feet, polydipsia, polyuria and visual disturbances. Last A1C in the office was:  Lab Results  Component Value Date   HGBA1C 6.9* 12/11/2014  Patient is on Vitamin D supplement. Lab Results  Component Value Date   VD25OH 60 12/11/2014   She is on celexa and xanax for depression, her son who is paranoid schizophrenic and had recent injury. Denies SI/HI.  She complains of dry cough, nonproductive, tickle in her throat x  1 year.  She had left hip fracture in 08/2013 from a fall, had a repair with Dr. Ave Filterhandler, and has had leg discrepancy since the fall, now has an insert to help with her left shoe and she is getting an insert. She has falls 4-5 times due to imbalance with her left foot, she denies dizziness, changes in vision, vertigo.   She however mentions that several weeks ago, she was talking with her friends while walking her dogs but she could not get her words to come out properly with some slurring, she felt generalized weakness at that time with palpitations and had an elevated BP of 177/95, denies changes in vision, focal weakness. Lasted for a few days but felt better after adding back on  atenolol 50mg . She admits to snoring at night, fatigue in the morning  BMI is Body mass index is 32.96 kg/(m^2)., she is working on diet and exercise She admits to stress eating and eating more sweet. Wt Readings from Last 3 Encounters:  04/02/15 186 lb (84.369 kg)  12/11/14 179 lb 9.6 oz (81.466 kg)  09/04/14 171 lb (77.565 kg)     Medication Review Current Outpatient Prescriptions on File Prior to Visit  Medication Sig Dispense Refill  . ALPRAZolam (XANAX) 0.5 MG tablet Take 1/2 to 1 tablet 3 x day if needed for anxiety 90 tablet 5  . aspirin EC 325 MG tablet Take 325 mg by mouth once.    Marland Kitchen. atenolol  (TENORMIN) 50 MG tablet TAKE 1 TABLET BY MOUTH EVERY DAY FOR BP & PULSE RATE 90 tablet 0  . BOOSTRIX 5-2.5-18.5 injection     . Cholecalciferol (VITAMIN D PO) Take 2,000 Units by mouth 3 (three) times daily.    . citalopram (CELEXA) 40 MG tablet TAKE 1 TABLET EVERY DAY 30 tablet 2  . CRESTOR 20 MG tablet TAKE 1 TABLET (20 MG TOTAL) BY MOUTH DAILY. 30 tablet 5  . Magnesium 250 MG TABS Take 250 mg by mouth daily.    . metFORMIN (GLUCOPHAGE-XR) 500 MG 24 hr tablet Take 1 to 2 tablets 2 x day with food for Diabetes 360 tablet 1  . Multiple Vitamins-Minerals (MULTIVITAMIN PO) Take by mouth daily.    . quinapril-hydrochlorothiazide (ACCURETIC) 20-12.5 MG per tablet TAKE 2 TABLETS BY MOUTH EVERY DAY FOR BLOOD PRESSURE 180 tablet 1   No current facility-administered medications on file prior to visit.    Current Problems (verified) Patient Active Problem List   Diagnosis Date Noted  . Diabetic retinopathy 04/02/2015  . Obesity 04/02/2015  . Medication management 12/11/2014  . CKD stage 2 due to type 2 diabetes mellitus   . Back pain   . History of kidney stones   . Vitamin D deficiency   . Depression 11/16/2013  .  Hyperlipidemia 11/16/2013  . Closed left hip fracture 09/28/2013  . Hypertension 09/28/2013   Screening Tests Immunization History  Administered Date(s) Administered  . Influenza Split 08/17/2013, 09/04/2014  . Pneumococcal Conjugate-13 05/29/2014  . Td 05/06/2006  . Tdap 09/04/2014    Preventative care: Last colonoscopy: 2013 due 2016 Last mammogram: 01/2014 Last pap smear/pelvic exam: 2013 negative no more after TAH  DEXA: 2014 good, need repeat this year with fracture 2014 CT head 2012 CT cervical 2010 MRI lumbar 2009 [place] AB 2012 CXR 2014  Prior vaccinations: TD or Tdap: 2015  Influenza: 2015 Pneumococcal: 2008 Prevnar 13: 2015 Shingles/Zostavax: 2012  Names of Other Physician/Practitioners you currently use: 1. [facility]- here for primary care 2. [facility], eye doctor, last visit May 2015 3. Dr. Ashley Royalty, retina specialist, sees him next week for retinopathy 4. Dr. Vonna Kotyk, will remove cataratcs 3. Dr. Shea Evans in [place], dentist, last visit q 6 months Patient Care Team: Lucky Cowboy, MD as PCP - General (Internal Medicine) Caryl Never, MD (Dentistry) Mia Creek, MD as Consulting Physician (Ophthalmology) Sharrell Ku, MD as Consulting Physician (Gastroenterology) Foster Simpson, MD as Consulting Physician (Obstetrics and Gynecology) Paulina Fusi, MD as Referring Physician (Orthopedic Surgery) Doristine Section, MD as Consulting Physician (Orthopedic Surgery) Luetta Nutting, MD as Referring Physician (Orthopedic Surgery)  Past Surgical History  Procedure Laterality Date  . Intramedullary (im) nail intertrochanteric Left 09/29/2013    Procedure: INTRAMEDULLARY (IM) NAIL INTERTROCHANTRIC;  Surgeon: Mable Paris, MD;  Location: [facility] OR;  Service: Orthopedics;  Laterality: Left;   Family History  Problem Relation Age of Onset  . Heart disease Mother   . Lupus Mother   . Kidney disease Mother   . Heart attack Father   . Heart disease Father   . Hypertension Father   . Cancer Sister   . Lupus Sister    History  Substance Use Topics  . Smoking status: Never Smoker   . Smokeless tobacco: Never Used  . Alcohol Use: No    MEDICARE WELLNESS OBJECTIVES: Tobacco use: She does not smoke.  Patient is not a former smoker. Alcohol Current alcohol use: none Caffeine Current caffeine use: coffee 1 /day Osteoporosis: postmenopausal estrogen deficiency and dietary calcium and/or vitamin D deficiency, History of fracture in the past year: No, had fracture 08/2013 of left hip.  Diet: in general, a "healthy" diet   Physical activity: no regular exercise but walks her dog Depression/mood screen:   Depression screen [facility] 2/9 04/02/2015  Decreased Interest 1  Down, Depressed, Hopeless  1  PHQ - 2 Score 2  Altered sleeping 1  Tired, decreased energy 2  Change in appetite 1  Feeling bad or failure about yourself  0  Trouble concentrating 1  Moving slowly or fidgety/restless 0  Suicidal thoughts 0  PHQ-9 Score 7  Difficult doing work/chores Somewhat difficult   Hearing: normal Visual acuity: wears glasses,  does perform annual eye exam  ADLs:  In your present state of health, do you have any difficulty performing the following activities: 04/02/2015  Hearing? N  Vision? Y  Difficulty concentrating or making decisions? N  Walking or climbing stairs? N  Dressing or bathing? N  Doing errands, shopping? N  Preparing Food and eating ? N  Using the Toilet? N  In the past six months, have you accidently leaked urine? N  Do you have problems with loss of bowel control? N  Managing your Medications? N  Managing your Finances?  N  Housekeeping or managing your Housekeeping? N    Fall risk: Moderate Risk Cognitive Testing  Alert? Yes  Normal Appearance?Yes  Oriented to person? Yes  Place? Yes   Time? Yes  Recall of three objects?  Yes  Can perform simple calculations? Yes  Displays appropriate judgment?Yes  Can read the correct time from a watch face?Yes  EOL planning: Does patient have an advance directive?: No Would patient like information on creating an advanced directive?: Yes - Educational materials given     Objective:   Blood pressure 128/78, pulse 60, temperature 97.7 F (36.5 C), resp. rate 16, height  (1.6 m), weight 186 lb (84.369 kg). Body mass index is 32.96 kg/(m^2).  HEENT: normocephalic, sclerae anicteric, TMs pearly, nares patent, no discharge or erythema, pharynx normal Oral cavity: MMM, no lesions Neck: supple, no lymphadenopathy, no thyromegaly, no masses Heart: RRR, normal S1, S2, no murmurs Lungs: CTA bilaterally, no wheezes, rhonchi, or rales Abdomen: +bs, soft, non tender, non distended, no masses, no hepatomegaly, no  splenomegaly Musculoskeletal: nontender, no swelling, no obvious deformity Extremities: no edema, no cyanosis, no clubbing Pulses: 2+ symmetric, upper and lower extremities, normal cap refill Neurological: alert, oriented x 3, CN2-12 intact, strength normal upper extremities and lower extremities, sensation normal throughout, DTRs 2+ throughout, no cerebellar signs, gait antalgic/irregular Psychiatric: normal affect, behavior normal, pleasant  Breast: defer Gyn: defer  Rectal: defer   Medicare Attestation I have personally reviewed: The patient's medical and social history Their use of alcohol, tobacco or illicit drugs Their current medications and supplements The patient's functional ability including ADLs,fall risks, home safety risks, cognitive, and hearing and visual impairment Diet and physical activities Evidence for depression or mood disorders  The patient's weight, height, BMI, and visual acuity have been recorded in the chart.  I have made referrals, counseling, and provided education to the patient based on review of the above and I have provided the patient with a written personalized care plan for preventive services.     Quentin Mulling, PA-C   04/02/2015

## 2015-04-03 ENCOUNTER — Other Ambulatory Visit: Payer: Self-pay | Admitting: Internal Medicine

## 2015-04-03 LAB — VITAMIN D 25 HYDROXY (VIT D DEFICIENCY, FRACTURES): Vit D, 25-Hydroxy: 72 ng/mL (ref 30–100)

## 2015-04-03 LAB — HEMOGLOBIN A1C
Hgb A1c MFr Bld: 7.4 % — ABNORMAL HIGH (ref <5.7)
MEAN PLASMA GLUCOSE: 166 mg/dL — AB (ref <117)

## 2015-04-03 LAB — TSH: TSH: 1.346 u[IU]/mL (ref 0.350–4.500)

## 2015-04-04 ENCOUNTER — Other Ambulatory Visit: Payer: Self-pay | Admitting: Internal Medicine

## 2015-05-16 ENCOUNTER — Ambulatory Visit (INDEPENDENT_AMBULATORY_CARE_PROVIDER_SITE_OTHER): Payer: Medicare Other | Admitting: Cardiology

## 2015-05-16 ENCOUNTER — Encounter: Payer: Self-pay | Admitting: Cardiology

## 2015-05-16 VITALS — BP 118/70 | HR 56 | Ht 63.0 in | Wt 179.1 lb

## 2015-05-16 DIAGNOSIS — R0602 Shortness of breath: Secondary | ICD-10-CM | POA: Diagnosis not present

## 2015-05-16 DIAGNOSIS — R002 Palpitations: Secondary | ICD-10-CM | POA: Diagnosis not present

## 2015-05-16 DIAGNOSIS — I10 Essential (primary) hypertension: Secondary | ICD-10-CM | POA: Diagnosis not present

## 2015-05-16 NOTE — Progress Notes (Signed)
Cardiology Office Note   Date:  05/16/2015   ID:  [name], DOB 1951-05-30, MRN [id number]  PCP:  [name] Corwin, MD  Cardiologist:   Donato Schultz, MD       History of Present Illness: [name] is a 64 y.o. female who presents for evaluation of palpitations, slurred speech here for possible Holter monitor, echocardiogram, stress test.  She mentioned to her primary provider that several weeks ago she was talking with her friends and walking her dogs but could not get the words to come out properly, felt slurring, generalize weakness, palpitations with blood pressure of 177 systolic. No other focal weaknesses. This seemed to last a few days and felt better after adding back atenolol 50 mg.  She is on both Celexa and Xanax for depression. Her son has paranoid schizophrenia. She had a left fracture in October 2014.  No more episodes of slurring. Wants to sleep all of the time. Screaming fits.   Palpitations have improved atenolol. No prior heart episodes.   She does state that she has dyspnea on exertion when walking her dog. She has attributed this in the past to her left leg being shorter than her right after her hip surgery, 1 inch. Denies any chest pain, syncope, bleeding, orthopnea.    Past Medical History  Diagnosis Date  . Hypertension   . Hyperlipidemia   . Fibromyalgia   . Back pain   . Diabetes mellitus without complication   . Type II or unspecified type diabetes mellitus without mention of complication, not stated as uncontrolled   . History of kidney stones   . Vitamin D deficiency     Past Surgical History  Procedure Laterality Date  . Intramedullary (im) nail intertrochanteric Left 09/29/2013    Procedure: INTRAMEDULLARY (IM) NAIL INTERTROCHANTRIC;  Surgeon: Mable Paris, MD;  Location: [facility] OR;  Service: Orthopedics;  Laterality: Left;     Current Outpatient Prescriptions  Medication Sig Dispense Refill  . ALPRAZolam  (XANAX) 0.5 MG tablet Take 1/2 to 1 tablet 3 x day if needed for anxiety 90 tablet 5  . aspirin EC 325 MG tablet Take 325 mg by mouth once.    Marland Kitchen atenolol (TENORMIN) 50 MG tablet TAKE 1 TABLET BY MOUTH EVERY DAY FOR BP & PULSE RATE 90 tablet 0  . BOOSTRIX 5-2.5-18.5 injection     . Cholecalciferol (VITAMIN D PO) Take 2,000 Units by mouth 3 (three) times daily.    . citalopram (CELEXA) 40 MG tablet TAKE 1 TABLET EVERY DAY 30 tablet 2  . CRESTOR 20 MG tablet TAKE 1 TABLET (20 MG TOTAL) BY MOUTH DAILY. 30 tablet 5  . Magnesium 250 MG TABS Take 250 mg by mouth daily.    Marland Kitchen METFORMIN HCL PO Take 4 tablets by mouth daily. Pt. Taking two tablets in the morning and 2 tablets in the evening. Pt. not sure of dosage.    . Multiple Vitamins-Minerals (MULTIVITAMIN PO) Take by mouth daily.    . quinapril-hydrochlorothiazide (ACCURETIC) 20-12.5 MG per tablet TAKE 2 TABLETS BY MOUTH EVERY DAY FOR BLOOD PRESSURE 180 tablet 1   No current facility-administered medications for this visit.    Allergies:   Other    Social History:  The patient  reports that she has never smoked. She has never used smokeless tobacco. She reports that she does not drink alcohol or use illicit drugs.   Family History:  The patient's family history includes Cancer in her sister; Heart  attack in her father; Heart disease in her father and mother; Hypertension in her father; Kidney disease in her mother; Lupus in her mother and sister.    ROS:  Please see the history of present illness.   Otherwise, review of systems are positive for none.   All other systems are reviewed and negative.    PHYSICAL EXAM: VS:  BP 118/70 mmHg  Pulse 56  Ht  (1.6 m)  Wt 179 lb 1.9 oz (81.248 kg)  BMI 31.74 kg/m2 , BMI Body mass index is 31.74 kg/(m^2). GEN: Well nourished, well developed, in no acute distress HEENT: normal Neck: no JVD, carotid bruits, or masses Cardiac: RRR; no murmurs, rubs, or gallops,no edema  Respiratory:  clear to  auscultation bilaterally, normal work of breathing GI: soft, nontender, nondistended, + BS MS: no deformity or atrophy, left leg shorter than right Skin: warm and dry, no rash Neuro:  Strength and sensation are intact Psych: euthymic mood, full affect   EKG:  EKG is ordered today. The ekg ordered today demonstrates 05/16/15-sinus bradycardia, nonspecific ST-T wave change, heart rate 56 bpm 05/29/14 EKG demonstrates sinus rhythm with no obvious ST segment changes. Personally viewed.   Recent Labs: 04/02/2015: ALT 15; BUN 15; Creat 0.96; Hemoglobin 11.9*; Magnesium 1.8; Platelets 289; Potassium 4.5; Sodium 143; TSH 1.346    Lipid Panel    Component Value Date/Time   CHOL 133 04/02/2015 1653   TRIG 228* 04/02/2015 1653   HDL 46 04/02/2015 1653   CHOLHDL 2.9 04/02/2015 1653   VLDL 46* 04/02/2015 1653   LDLCALC 41 04/02/2015 1653      Wt Readings from Last 3 Encounters:  05/16/15 179 lb 1.9 oz (81.248 kg)  04/02/15 186 lb (84.369 kg)  12/11/14 179 lb 9.6 oz (81.466 kg)     Other studies Reviewed: Additional studies/ records that were reviewed today include: Prior office notes, EKG, lab work. Review of the above records demonstrates: as above   ASSESSMENT AND PLAN:  1.  Dyspnea on exertion-I will check an echocardiogram to ensure proper structure and function of her heart. She is noticing this when walking the dog. She thinks it is likely secondary to her left leg being shorter than her right and the increased energy expenditure.  2. Palpitations-these been extinguished with atenolol. Holter monitor utility would be quite low at this point. She is no longer feeling any symptoms. If symptoms return or become more frequent, we can consider Holter monitor at that time. Continue with atenolol.  3. Possible TIA-had trouble with word finding, slurring words. No further symptoms. Continue with secondary prevention, cholesterol modification, statin, aspirin. Blood pressure  control.   Current medicines are reviewed at length with the patient today.  The patient does not have concerns regarding medicines.  The following changes have been made:  no change  Labs/ tests ordered today include: Echocardiogram   Orders Placed This Encounter  Procedures  . EKG 12-Lead  . Echocardiogram     Disposition:  We will follow-up with results of testing. She knows to contact me if her symptoms become more worrisome.  Mathews Robinsons, MD  05/16/2015 12:05 PM    [facility] [address] [place], [place], [place]  [address] Phone: [phone]; Fax: [phone]

## 2015-05-16 NOTE — Patient Instructions (Signed)
Medication Instructions:  Your physician recommends that you continue on your current medications as directed. Please refer to the Current Medication list given to you today.  Testing/Procedures: Your physician has requested that you have an echocardiogram. Echocardiography is a painless test that uses sound waves to create images of your heart. It provides your doctor with information about the size and shape of your heart and how well your heart's chambers and valves are working. This procedure takes approximately one hour. There are no restrictions for this procedure.  Follow-Up: As needed  Thank you for choosing [facility]!!

## 2015-05-26 ENCOUNTER — Other Ambulatory Visit: Payer: Self-pay | Admitting: Internal Medicine

## 2015-05-31 ENCOUNTER — Other Ambulatory Visit (HOSPITAL_COMMUNITY): Payer: Medicare Other

## 2015-06-03 ENCOUNTER — Other Ambulatory Visit: Payer: Self-pay | Admitting: Internal Medicine

## 2015-06-05 ENCOUNTER — Encounter: Payer: Self-pay | Admitting: Physician Assistant

## 2015-06-25 ENCOUNTER — Ambulatory Visit (INDEPENDENT_AMBULATORY_CARE_PROVIDER_SITE_OTHER): Payer: Medicare Other | Admitting: Ophthalmology

## 2015-07-03 ENCOUNTER — Encounter: Payer: Self-pay | Admitting: Physician Assistant

## 2015-07-04 ENCOUNTER — Encounter: Payer: Self-pay | Admitting: Physician Assistant

## 2015-07-04 DIAGNOSIS — Z0000 Encounter for general adult medical examination without abnormal findings: Secondary | ICD-10-CM | POA: Insufficient documentation

## 2015-07-05 ENCOUNTER — Ambulatory Visit (INDEPENDENT_AMBULATORY_CARE_PROVIDER_SITE_OTHER): Payer: Medicare Other | Admitting: Physician Assistant

## 2015-07-05 ENCOUNTER — Encounter: Payer: Self-pay | Admitting: Physician Assistant

## 2015-07-05 VITALS — BP 126/70 | HR 62 | Temp 98.2°F | Resp 16 | Ht 63.0 in | Wt 185.0 lb

## 2015-07-05 DIAGNOSIS — S72002D Fracture of unspecified part of neck of left femur, subsequent encounter for closed fracture with routine healing: Secondary | ICD-10-CM

## 2015-07-05 DIAGNOSIS — E785 Hyperlipidemia, unspecified: Secondary | ICD-10-CM

## 2015-07-05 DIAGNOSIS — Z0001 Encounter for general adult medical examination with abnormal findings: Secondary | ICD-10-CM

## 2015-07-05 DIAGNOSIS — Z79899 Other long term (current) drug therapy: Secondary | ICD-10-CM

## 2015-07-05 DIAGNOSIS — Z0000 Encounter for general adult medical examination without abnormal findings: Secondary | ICD-10-CM | POA: Diagnosis not present

## 2015-07-05 DIAGNOSIS — E669 Obesity, unspecified: Secondary | ICD-10-CM

## 2015-07-05 DIAGNOSIS — R05 Cough: Secondary | ICD-10-CM

## 2015-07-05 DIAGNOSIS — E2839 Other primary ovarian failure: Secondary | ICD-10-CM

## 2015-07-05 DIAGNOSIS — F32A Depression, unspecified: Secondary | ICD-10-CM

## 2015-07-05 DIAGNOSIS — Z9181 History of falling: Secondary | ICD-10-CM

## 2015-07-05 DIAGNOSIS — Z87442 Personal history of urinary calculi: Secondary | ICD-10-CM

## 2015-07-05 DIAGNOSIS — E1122 Type 2 diabetes mellitus with diabetic chronic kidney disease: Secondary | ICD-10-CM

## 2015-07-05 DIAGNOSIS — E559 Vitamin D deficiency, unspecified: Secondary | ICD-10-CM

## 2015-07-05 DIAGNOSIS — N182 Chronic kidney disease, stage 2 (mild): Secondary | ICD-10-CM

## 2015-07-05 DIAGNOSIS — E113599 Type 2 diabetes mellitus with proliferative diabetic retinopathy without macular edema, unspecified eye: Secondary | ICD-10-CM

## 2015-07-05 DIAGNOSIS — F329 Major depressive disorder, single episode, unspecified: Secondary | ICD-10-CM

## 2015-07-05 DIAGNOSIS — Z6831 Body mass index (BMI) 31.0-31.9, adult: Secondary | ICD-10-CM

## 2015-07-05 DIAGNOSIS — R059 Cough, unspecified: Secondary | ICD-10-CM

## 2015-07-05 DIAGNOSIS — M545 Low back pain: Secondary | ICD-10-CM

## 2015-07-05 DIAGNOSIS — I10 Essential (primary) hypertension: Secondary | ICD-10-CM

## 2015-07-05 LAB — CBC WITH DIFFERENTIAL/PLATELET
Basophils Absolute: 0 10*3/uL (ref 0.0–0.1)
Basophils Relative: 0 % (ref 0–1)
EOS PCT: 3 % (ref 0–5)
Eosinophils Absolute: 0.2 10*3/uL (ref 0.0–0.7)
HCT: 37 % (ref 36.0–46.0)
HEMOGLOBIN: 11.5 g/dL — AB (ref 12.0–15.0)
LYMPHS ABS: 2 10*3/uL (ref 0.7–4.0)
Lymphocytes Relative: 37 % (ref 12–46)
MCH: 26.1 pg (ref 26.0–34.0)
MCHC: 31.1 g/dL (ref 30.0–36.0)
MCV: 84.1 fL (ref 78.0–100.0)
MONOS PCT: 8 % (ref 3–12)
MPV: 10.4 fL (ref 8.6–12.4)
Monocytes Absolute: 0.4 10*3/uL (ref 0.1–1.0)
NEUTROS ABS: 2.9 10*3/uL (ref 1.7–7.7)
Neutrophils Relative %: 52 % (ref 43–77)
Platelets: 286 10*3/uL (ref 150–400)
RBC: 4.4 MIL/uL (ref 3.87–5.11)
RDW: 15.3 % (ref 11.5–15.5)
WBC: 5.5 10*3/uL (ref 4.0–10.5)

## 2015-07-05 MED ORDER — VALSARTAN-HYDROCHLOROTHIAZIDE 160-12.5 MG PO TABS: 1.0000 | ORAL_TABLET | Freq: Every day | ORAL | Status: DC

## 2015-07-05 NOTE — Patient Instructions (Addendum)
Stop the quinapril and start the diovan 160/12.5 1 pill daily for your blood pressure Monitor your blood pressure, if above 130/80 call us.   Monitor your blood pressure at home, if it is above 140/90 consistently call the office so we can adjust your medications. Go to the ER if any CP, SOB, nausea, dizziness, severe HA, changes vision/speech  DASH Eating Plan DASH stands for "Dietary Approaches to Stop Hypertension." The DASH eating plan is a healthy eating plan that has been shown to reduce high blood pressure (hypertension). Additional health benefits may include reducing the risk of type 2 diabetes mellitus, heart disease, and stroke. The DASH eating plan may also help with weight loss. WHAT DO I NEED TO KNOW ABOUT THE DASH EATING PLAN? For the DASH eating plan, you will follow these general guidelines:  Choose foods with a percent daily value for sodium of less than 5% (as listed on the food label).  Use salt-free seasonings or herbs instead of table salt or sea salt.  Check with your health care provider or pharmacist before using salt substitutes.  Eat lower-sodium products, often labeled as "lower sodium" or "no salt added."  Eat fresh foods.  Eat more vegetables, fruits, and low-fat dairy products.  Choose whole grains. Look for the word "whole" as the first word in the ingredient list.  Choose fish and skinless chicken or [place] more often than red meat. Limit fish, poultry, and meat to 6 oz (170 g) each day.  Limit sweets, desserts, sugars, and sugary drinks.  Choose heart-healthy fats.  Limit cheese to 1 oz (28 g) per day.  Eat more home-cooked food and less restaurant, buffet, and fast food.  Limit fried foods.  Cook foods using methods other than frying.  Limit canned vegetables. If you do use them, rinse them well to decrease the sodium.  When eating at a restaurant, ask that your food be prepared with less salt, or no salt if possible. WHAT FOODS CAN I  EAT? Seek help from a dietitian for individual calorie needs. Grains Whole grain or whole wheat bread. Brown rice. Whole grain or whole wheat pasta. Quinoa, bulgur, and whole grain cereals. Low-sodium cereals. Corn or whole wheat flour tortillas. Whole grain cornbread. Whole grain crackers. Low-sodium crackers. Vegetables Fresh or frozen vegetables (raw, steamed, roasted, or grilled). Low-sodium or reduced-sodium tomato and vegetable juices. Low-sodium or reduced-sodium tomato sauce and paste. Low-sodium or reduced-sodium canned vegetables.  Fruits All fresh, canned (in natural juice), or frozen fruits. Meat and Other Protein Products Ground beef (85% or leaner), grass-fed beef, or beef trimmed of fat. Skinless chicken or [place]. Ground chicken or [place]. Pork trimmed of fat. All fish and seafood. Eggs. Dried beans, peas, or lentils. Unsalted nuts and seeds. Unsalted canned beans. Dairy Low-fat dairy products, such as skim or 1% milk, 2% or reduced-fat cheeses, low-fat ricotta or cottage cheese, or plain low-fat yogurt. Low-sodium or reduced-sodium cheeses. Fats and Oils Tub margarines without trans fats. Light or reduced-fat mayonnaise and salad dressings (reduced sodium). Avocado. Safflower, olive, or canola oils. Natural peanut or almond butter. Other Unsalted popcorn and pretzels. The items listed above may not be a complete list of recommended foods or beverages. Contact your dietitian for more options. WHAT FOODS ARE NOT RECOMMENDED? Grains White bread. White pasta. White rice. Refined cornbread. Bagels and croissants. Crackers that contain trans fat. Vegetables Creamed or fried vegetables. Vegetables in a cheese sauce. Regular canned vegetables. Regular canned tomato sauce and paste. Regular tomato and  vegetable juices. Fruits Dried fruits. Canned fruit in light or heavy syrup. Fruit juice. Meat and Other Protein Products Fatty cuts of meat. Ribs, chicken wings, bacon, sausage,  bologna, salami, chitterlings, fatback, hot dogs, bratwurst, and packaged luncheon meats. Salted nuts and seeds. Canned beans with salt. Dairy Whole or 2% milk, cream, half-and-half, and cream cheese. Whole-fat or sweetened yogurt. Full-fat cheeses or blue cheese. Nondairy creamers and whipped toppings. Processed cheese, cheese spreads, or cheese curds. Condiments Onion and garlic salt, seasoned salt, table salt, and sea salt. Canned and packaged gravies. Worcestershire sauce. Tartar sauce. Barbecue sauce. Teriyaki sauce. Soy sauce, including reduced sodium. Steak sauce. Fish sauce. Oyster sauce. Cocktail sauce. Horseradish. Ketchup and mustard. Meat flavorings and tenderizers. Bouillon cubes. Hot sauce. Tabasco sauce. Marinades. Taco seasonings. Relishes. Fats and Oils Butter, stick margarine, lard, shortening, ghee, and bacon fat. Coconut, palm kernel, or palm oils. Regular salad dressings. Other Pickles and olives. Salted popcorn and pretzels. The items listed above may not be a complete list of foods and beverages to avoid. Contact your dietitian for more information. WHERE CAN I FIND MORE INFORMATION? National [employer], [employer], [employer]: travelstabl[employer].com Document Released: 11/06/2011 Document Revised: 04/03/2014 Document Reviewed: 09/21/2013 [facility] Patient Information 2015 [place], [place]. This information is not intended to replace advice given to you by your health care provider. Make sure you discuss any questions you have with your health care provider.  Preventive Care for Adults A healthy lifestyle and preventive care can promote health and wellness. Preventive health guidelines for women include the following key practices.  A routine yearly physical is a good way to check with your health care provider about your health and preventive screening. It is a chance to share any concerns and updates on your health and to receive a thorough  exam.  Visit your dentist for a routine exam and preventive care every 6 months. Brush your teeth twice a day and floss once a day. Good oral hygiene prevents tooth decay and gum disease.  The frequency of eye exams is based on your age, health, family medical history, use of contact lenses, and other factors. Follow your health care provider's recommendations for frequency of eye exams.  Eat a healthy diet. Foods like vegetables, fruits, whole grains, low-fat dairy products, and lean protein foods contain the nutrients you need without too many calories. Decrease your intake of foods high in solid fats, added sugars, and salt. Eat the right amount of calories for you.Get information about a proper diet from your health care provider, if necessary.  Regular physical exercise is one of the most important things you can do for your health. Most adults should get at least 150 minutes of moderate-intensity exercise (any activity that increases your heart rate and causes you to sweat) each week. In addition, most adults need muscle-strengthening exercises on 2 or more days a week.  Maintain a healthy weight. The body mass index (BMI) is a screening tool to identify possible weight problems. It provides an estimate of body fat based on height and weight. Your health care provider can find your BMI and can help you achieve or maintain a healthy weight.For adults 20 years and older:  A BMI below 18.5 is considered underweight.  A BMI of 18.5 to 24.9 is normal.  A BMI of 25 to 29.9 is considered overweight.  A BMI of 30 and above is considered obese.  Maintain normal blood lipids and cholesterol levels by exercising and minimizing your intake of saturated  fat. Eat a balanced diet with plenty of fruit and vegetables. If your lipid or cholesterol levels are high, you are over 50, or you are at high risk for heart disease, you may need your cholesterol levels checked more frequently.Ongoing high lipid and  cholesterol levels should be treated with medicines if diet and exercise are not working.  If you smoke, find out from your health care provider how to quit. If you do not use tobacco, do not start.  Lung cancer screening is recommended for adults aged 33-80 years who are at high risk for developing lung cancer because of a history of smoking. A yearly low-dose CT scan of the lungs is recommended for people who have at least a 30-pack-year history of smoking and are a current smoker or have quit within the past 15 years. A pack year of smoking is smoking an average of 1 pack of cigarettes a day for 1 year (for example: 1 pack a day for 30 years or 2 packs a day for 15 years). Yearly screening should continue until the smoker has stopped smoking for at least 15 years. Yearly screening should be stopped for people who develop a health problem that would prevent them from having lung cancer treatment.  Avoid use of street drugs. Do not share needles with anyone. Ask for help if you need support or instructions about stopping the use of drugs.  High blood pressure causes heart disease and increases the risk of stroke.  Ongoing high blood pressure should be treated with medicines if weight loss and exercise do not work.  If you are 60-14 years old, ask your health care provider if you should take aspirin to prevent strokes.  Diabetes screening involves taking a blood sample to check your fasting blood sugar level. This should be done once every 3 years, after age 48, if you are within normal weight and without risk factors for diabetes. Testing should be considered at a younger age or be carried out more frequently if you are overweight and have at least 1 risk factor for diabetes.  Breast cancer screening is essential preventive care for women. You should practice "breast self-awareness." This means understanding the normal appearance and feel of your breasts and may include breast self-examination. Any  changes detected, no matter how small, should be reported to a health care provider. Women in their 63s and 30s should have a clinical breast exam (CBE) by a health care provider as part of a regular health exam every 1 to 3 years. After age 38, women should have a CBE every year. Starting at age 63, women should consider having a mammogram (breast X-ray test) every year. Women who have a family history of breast cancer should talk to their health care provider about genetic screening. Women at a high risk of breast cancer should talk to their health care providers about having an MRI and a mammogram every year.  Breast cancer gene (BRCA)-related cancer risk assessment is recommended for women who have family members with BRCA-related cancers. BRCA-related cancers include breast, ovarian, tubal, and peritoneal cancers. Having family members with these cancers may be associated with an increased risk for harmful changes (mutations) in the breast cancer genes BRCA1 and BRCA2. Results of the assessment will determine the need for genetic counseling and BRCA1 and BRCA2 testing.  Routine pelvic exams to screen for cancer are no longer recommended for nonpregnant women who are considered low risk for cancer of the pelvic organs (ovaries, uterus, and  vagina) and who do not have symptoms. Ask your health care provider if a screening pelvic exam is right for you.  If you have had past treatment for cervical cancer or a condition that could lead to cancer, you need Pap tests and screening for cancer for at least 20 years after your treatment. If Pap tests have been discontinued, your risk factors (such as having a new sexual partner) need to be reassessed to determine if screening should be resumed. Some women have medical problems that increase the chance of getting cervical cancer. In these cases, your health care provider may recommend more frequent screening and Pap tests.    Colorectal cancer can be detected  and often prevented. Most routine colorectal cancer screening begins at the age of 62 years and continues through age 48 years. However, your health care provider may recommend screening at an earlier age if you have risk factors for colon cancer. On a yearly basis, your health care provider may provide home test kits to check for hidden blood in the stool. Use of a small camera at the end of a tube, to directly examine the colon (sigmoidoscopy or colonoscopy), can detect the earliest forms of colorectal cancer. Talk to your health care provider about this at age 21, when routine screening begins. Direct exam of the colon should be repeated every 5-10 years through age 94 years, unless early forms of pre-cancerous polyps or small growths are found.  Osteoporosis is a disease in which the bones lose minerals and strength with aging. This can result in serious bone fractures or breaks. The risk of osteoporosis can be identified using a bone density scan. Women ages 1 years and over and women at risk for fractures or osteoporosis should discuss screening with their health care providers. Ask your health care provider whether you should take a calcium supplement or vitamin D to reduce the rate of osteoporosis.  Menopause can be associated with physical symptoms and risks. Hormone replacement therapy is available to decrease symptoms and risks. You should talk to your health care provider about whether hormone replacement therapy is right for you.  Use sunscreen. Apply sunscreen liberally and repeatedly throughout the day. You should seek shade when your shadow is shorter than you. Protect yourself by wearing long sleeves, pants, a wide-brimmed hat, and sunglasses year round, whenever you are outdoors.  Once a month, do a whole body skin exam, using a mirror to look at the skin on your back. Tell your health care provider of new moles, moles that have irregular borders, moles that are larger than a pencil  eraser, or moles that have changed in shape or color.  Stay current with required vaccines (immunizations).  Influenza vaccine. All adults should be immunized every year.  Tetanus, diphtheria, and acellular pertussis (Td, Tdap) vaccine. Pregnant women should receive 1 dose of Tdap vaccine during each pregnancy. The dose should be obtained regardless of the length of time since the last dose. Immunization is preferred during the 27th-36th week of gestation. An adult who has not previously received Tdap or who does not know her vaccine status should receive 1 dose of Tdap. This initial dose should be followed by tetanus and diphtheria toxoids (Td) booster doses every 10 years. Adults with an unknown or incomplete history of completing a 3-dose immunization series with Td-containing vaccines should begin or complete a primary immunization series including a Tdap dose. Adults should receive a Td booster every 10 years.    Zoster vaccine.  One dose is recommended for adults aged 21 years or older unless certain conditions are present.    Pneumococcal 13-valent conjugate (PCV13) vaccine. When indicated, a person who is uncertain of her immunization history and has no record of immunization should receive the PCV13 vaccine. An adult aged 55 years or older who has certain medical conditions and has not been previously immunized should receive 1 dose of PCV13 vaccine. This PCV13 should be followed with a dose of pneumococcal polysaccharide (PPSV23) vaccine. The PPSV23 vaccine dose should be obtained at least 8 weeks after the dose of PCV13 vaccine. An adult aged 64 years or older who has certain medical conditions and previously received 1 or more doses of PPSV23 vaccine should receive 1 dose of PCV13. The PCV13 vaccine dose should be obtained 1 or more years after the last PPSV23 vaccine dose.    Pneumococcal polysaccharide (PPSV23) vaccine. When PCV13 is also indicated, PCV13 should be obtained first. All  adults aged 56 years and older should be immunized. An adult younger than age 90 years who has certain medical conditions should be immunized. Any person who resides in a nursing home or long-term care facility should be immunized. An adult smoker should be immunized. People with an immunocompromised condition and certain other conditions should receive both PCV13 and PPSV23 vaccines. People with human immunodeficiency virus (HIV) infection should be immunized as soon as possible after diagnosis. Immunization during chemotherapy or radiation therapy should be avoided. Routine use of PPSV23 vaccine is not recommended for American Indians, [place] Natives, or people younger than 65 years unless there are medical conditions that require PPSV23 vaccine. When indicated, people who have unknown immunization and have no record of immunization should receive PPSV23 vaccine. One-time revaccination 5 years after the first dose of PPSV23 is recommended for people aged 19-64 years who have chronic kidney failure, nephrotic syndrome, asplenia, or immunocompromised conditions. People who received 1-2 doses of PPSV23 before age 53 years should receive another dose of PPSV23 vaccine at age 36 years or later if at least 5 years have passed since the previous dose. Doses of PPSV23 are not needed for people immunized with PPSV23 at or after age 12 years.   Preventive Services / Frequency  Ages 30 years and over  Blood pressure check.  Lipid and cholesterol check.  Lung cancer screening. / Every year if you are aged 47-80 years and have a 30-pack-year history of smoking and currently smoke or have quit within the past 15 years. Yearly screening is stopped once you have quit smoking for at least 15 years or develop a health problem that would prevent you from having lung cancer treatment.  Clinical breast exam.** / Every year after age 78 years.  BRCA-related cancer risk assessment.** / For women who have family members  with a BRCA-related cancer (breast, ovarian, tubal, or peritoneal cancers).  Mammogram.** / Every year beginning at age 14 years and continuing for as long as you are in good health. Consult with your health care provider.  Pap test.** / Every 3 years starting at age 31 years through age 16 or 81 years with 3 consecutive normal Pap tests. Testing can be stopped between 65 and 70 years with 3 consecutive normal Pap tests and no abnormal Pap or HPV tests in the past 10 years.  Fecal occult blood test (FOBT) of stool. / Every year beginning at age 37 years and continuing until age 32 years. You may not need to do this test if you  get a colonoscopy every 10 years.  Flexible sigmoidoscopy or colonoscopy.** / Every 5 years for a flexible sigmoidoscopy or every 10 years for a colonoscopy beginning at age 26 years and continuing until age 19 years.  Hepatitis C blood test.** / For all people born from [birth year] through 1967 and any individual with known risks for hepatitis C.  Osteoporosis screening.** / A one-time screening for women ages 49 years and over and women at risk for fractures or osteoporosis.  Skin self-exam. / Monthly.  Influenza vaccine. / Every year.  Tetanus, diphtheria, and acellular pertussis (Tdap/Td) vaccine.** / 1 dose of Td every 10 years.  Zoster vaccine.** / 1 dose for adults aged 62 years or older.  Pneumococcal 13-valent conjugate (PCV13) vaccine.** / Consult your health care provider.  Pneumococcal polysaccharide (PPSV23) vaccine.** / 1 dose for all adults aged 56 years and older. Screening for abdominal aortic aneurysm (AAA)  by ultrasound is recommended for people who have history of high blood pressure or who are current or former smokers.

## 2015-07-05 NOTE — Progress Notes (Signed)
Complete Physical  Assessment and Plan: 1. Essential hypertension - continue medications, DASH diet, exercise and monitor at home. Call if greater than 130/80.  - valsartan-hydrochlorothiazide (DIOVAN HCT) 160-12.5 MG per tablet; Take 1 tablet by mouth daily.  Dispense: 30 tablet; Refill: 0 - CBC with Differential/Platelet - BASIC METABOLIC PANEL WITH GFR - Hepatic function panel - TSH - Urinalysis, Routine w reflex microscopic (not at [facility]) - Microalbumin / creatinine urine ratio  2. CKD stage 2 due to type 2 diabetes mellitus Discussed general issues about diabetes pathophysiology and management., Educational material distributed., Suggested low cholesterol diet., Encouraged aerobic exercise., Discussed foot care., Reminded to get yearly retinal exam. - BASIC METABOLIC PANEL WITH GFR - Hemoglobin A1c - Insulin, fasting - Urinalysis, Routine w reflex microscopic (not at [facility]) - Microalbumin / creatinine urine ratio - HM DIABETES FOOT EXAM  3. Hyperlipidemia -continue medications, check lipids, decrease fatty foods, increase activity.  - Lipid panel  4. Obesity Obesity with co morbidities- long discussion about weight loss, diet, and exercise  5. Medication management - Magnesium  6. Vitamin D deficiency - Vit D  25 hydroxy (rtn osteoporosis monitoring)  7. 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 Ashley Royalty, decrease sugars  8. Depression States controlled on meds, declines changes  9. Low back pain, unspecified back pain laterality, with sciatica presence unspecified RICE, NSAIDS, exercises given, if not better get xray and PT referral or ortho referral.   10. Closed left hip fracture, with routine healing, subsequent encounter She got the lift for left shoe and is helping some with balance, but declines PT  11. History of kidney stones Increase fluids  12. At high risk for falls She got  the lift for left shoe and is helping some with balance, but declines PT due to cost  13. Encounter for general adult medical examination with abnormal findings  14. Cough Likely from ACE, stop ace - valsartan-hydrochlorothiazide (DIOVAN HCT) 160-12.5 MG per tablet; Take 1 tablet by mouth daily.  Dispense: 30 tablet; Refill: 0  15. Estrogen deficiency - DG Bone Density; Future  Discussed med's effects and SE's. Screening labs and tests as requested with regular follow-up as recommended. Over 40 minutes of exam, counseling, chart review, and complex, high level critical decision making was performed this visit.   HPI  64 y.o. female  presents for a complete physical.  Her blood pressure has been controlled at home, today their BP is BP: 126/70 mmHg  She does complain of a dry cough, she is on an ace.  She does not workout. She denies chest pain, shortness of breath, dizziness.  She is on cholesterol medication and denies myalgias. Her cholesterol is at goal. The cholesterol last visit was:   Lab Results  Component Value Date   CHOL 133 04/02/2015   HDL 46 04/02/2015   LDLCALC 41 04/02/2015   TRIG 228* 04/02/2015   CHOLHDL 2.9 04/02/2015   She has been working on diet and exercise for diabetes with CKD stage II, she is on bASA, she is on ACE/ARB and denies paresthesia of the feet, polydipsia, polyuria and visual disturbances. Last A1C in the office was:  Lab Results  Component Value Date   HGBA1C 7.4* 04/02/2015   Patient is on Vitamin D supplement.   Lab Results  Component Value Date   VD25OH 72 04/02/2015     She is on celexa/xanax for depression. No SI/HI. She has increased stress with  her son, he is paranoid schizophrenic and he is at her house, complains that he is having paranoid delusions at this time, just discharged.  Last OV had some slurred speech, SOB with exertion, sent to Dr. Anne Fu, suggest for her to remain on BB, ASA, and is getting echo. Has this scheduled in  the future.   BMI is Body mass index is 32.78 kg/(m^2)., she is working on diet and exercise. Wt Readings from Last 3 Encounters:  07/05/15 185 lb (83.915 kg)  05/16/15 179 lb 1.9 oz (81.248 kg)  04/02/15 186 lb (84.369 kg)    Current Medications:  Current Outpatient Prescriptions on File Prior to Visit  Medication Sig Dispense Refill  . ALPRAZolam (XANAX) 0.5 MG tablet Take 1/2 to 1 tablet 3 x day if needed for anxiety 90 tablet 5  . aspirin EC 325 MG tablet Take 325 mg by mouth once.    Marland Kitchen atenolol (TENORMIN) 50 MG tablet TAKE 1 TABLET BY MOUTH EVERY DAY FOR BP & PULSE RATE 90 tablet 0  . Cholecalciferol (VITAMIN D PO) Take 2,000 Units by mouth 3 (three) times daily.    . citalopram (CELEXA) 40 MG tablet TAKE 1 TABLET EVERY DAY 30 tablet 2  . CRESTOR 20 MG tablet TAKE 1 TABLET (20 MG TOTAL) BY MOUTH DAILY. 30 tablet 5  . Magnesium 250 MG TABS Take 250 mg by mouth daily.    . metFORMIN (GLUCOPHAGE-XR) 500 MG 24 hr tablet TAKE 1 TO 2 TABLETS BY MOUTH TWICE DAILY WITH FOOD 360 tablet 1  . Multiple Vitamins-Minerals (MULTIVITAMIN PO) Take by mouth daily.    . quinapril-hydrochlorothiazide (ACCURETIC) 20-12.5 MG per tablet TAKE 2 TABLETS BY MOUTH EVERY DAY FOR BLOOD PRESSURE 180 tablet 1   No current facility-administered medications on file prior to visit.   Health Maintenance:   Immunization History  Administered Date(s) Administered  . Influenza Split 08/17/2013, 09/04/2014  . Pneumococcal Conjugate-13 05/29/2014  . Td 05/06/2006  . Tdap 09/04/2014   Last colonoscopy: 2013 due 2016 THIS YEAR, has appointment Aug 18th Last mammogram: 01/2014, over due Last pap smear/pelvic exam: 2013 negative no more after TAH  DEXA: 2014 good, need repeat this year with fracture 2014 DUE CT head 2012 CT cervical 2010 MRI lumbar 2009 [place] AB 2012 CXR 2014  Prior vaccinations: TD or Tdap: 2015 Influenza: 2015 Pneumococcal: 2008 Prevnar 13: 2015 Shingles/Zostavax: 2012  Last Dental  Exam: Dr. Shea Evans Last Eye Exam: Dr. Vonna Kotyk May 2016 [facility]- May 2015 Dr. Ashley Royalty- retinopathy May 2016 Patient Care Team: Lucky Cowboy, MD as PCP - General (Internal Medicine) Caryl Never, MD (Dentistry) Mia Creek, MD as Consulting Physician (Ophthalmology) Sharrell Ku, MD as Consulting Physician (Gastroenterology) Foster Simpson, MD as Consulting Physician (Obstetrics and Gynecology) Paulina Fusi, MD as Referring Physician (Orthopedic Surgery) Doristine Section, MD as Consulting Physician (Orthopedic Surgery) Luetta Nutting, MD as Referring Physician (Orthopedic Surgery)  Allergies:  Allergies  Allergen Reactions  . Other Other (See Comments)    Unknown anesthetic; unknown reaction; Anesthetic was given during back surgery   Medical History:  Past Medical History  Diagnosis Date  . Hypertension   . Hyperlipidemia   . Fibromyalgia   . Back pain   . Diabetes mellitus without complication   . Type II or unspecified type diabetes mellitus without mention of complication, not stated as uncontrolled   . History of kidney stones   . Vitamin D deficiency    Surgical History:  Past Surgical History  Procedure  Laterality Date  . Intramedullary (im) nail intertrochanteric Left 09/29/2013    Procedure: INTRAMEDULLARY (IM) NAIL INTERTROCHANTRIC;  Surgeon: Mable Paris, MD;  Location: [facility] OR;  Service: Orthopedics;  Laterality: Left;   Family History:  Family History  Problem Relation Age of Onset  . Heart disease Mother   . Lupus Mother   . Kidney disease Mother   . Heart attack Father   . Heart disease Father   . Hypertension Father   . Cancer Sister   . Lupus Sister    Social History:  History  Substance Use Topics  . Smoking status: Never Smoker   . Smokeless tobacco: Never Used  . Alcohol Use: No   Review of Systems: Review of Systems  Constitutional: Positive for weight loss. Negative for fever, chills, malaise/fatigue and diaphoresis.   HENT: Negative for congestion, ear discharge, ear pain, hearing loss, nosebleeds, sore throat and tinnitus.        + snoring  Eyes: Negative.   Respiratory: Positive for cough. Negative for hemoptysis, sputum production, shortness of breath, wheezing and stridor.   Cardiovascular: Negative.   Gastrointestinal: Positive for diarrhea. Negative for heartburn, nausea, vomiting, abdominal pain, constipation, blood in stool (no mucus) and melena.  Genitourinary: Negative.        + stress incontinence  Musculoskeletal: Negative.   Skin: Negative.   Neurological: Negative.  Negative for weakness and headaches.  Psychiatric/Behavioral: Positive for depression. Negative for suicidal ideas, hallucinations, memory loss and substance abuse. The patient has insomnia. The patient is not nervous/anxious.     Physical Exam: Estimated body mass index is 32.78 kg/(m^2) as calculated from the following:   Height as of this encounter: 5\' 3"  (1.6 m).   Weight as of this encounter: 185 lb (83.915 kg). BP 126/70 mmHg  Pulse 62  Temp(Src) 98.2 F (36.8 C) (Temporal)  Resp 16  Ht 5\' 3"  (1.6 m)  Wt 185 lb (83.915 kg)  BMI 32.78 kg/m2 General Appearance: Well nourished, in no apparent distress.  Eyes: PERRLA, EOMs, conjunctiva no swelling or erythema, normal fundi and vessels.  Sinuses: No Frontal/maxillary tenderness  ENT/Mouth: Ext aud canals clear, normal light reflex with TMs without erythema, bulging. Good dentition. No erythema, swelling, or exudate on post pharynx. Tonsils not swollen or erythematous. Hearing normal.  Neck: Supple, thyroid normal. No bruits  Respiratory: Respiratory effort normal, BS equal bilaterally without rales, rhonchi, wheezing or stridor.  Cardio: RRR without murmurs, rubs or gallops. Brisk peripheral pulses without edema.  Chest: symmetric, with normal excursions and percussion.  Breasts: Symmetric, without lumps, nipple discharge, retractions.  Abdomen: Soft, nontender, no  guarding, rebound, hernias, masses, or organomegaly.  Lymphatics: Non tender without lymphadenopathy.  Genitourinary:  Musculoskeletal: Full ROM all peripheral extremities,5/5 strength, and gait antalgic  Skin: Warm, dry without rashes, lesions, ecchymosis. Neuro: Cranial nerves intact, reflexes equal bilaterally. Normal muscle tone, no cerebellar symptoms. Sensation intact.  Psych: Awake and oriented X 3, normal affect, Insight and Judgment appropriate.    EKG: defer recent cardiology visit AORTA SCAN: defer   Quentin Mulling 2:12 PM [facility]

## 2015-07-06 LAB — URINALYSIS, ROUTINE W REFLEX MICROSCOPIC
BILIRUBIN URINE: NEGATIVE
Glucose, UA: NEGATIVE
HGB URINE DIPSTICK: NEGATIVE
Ketones, ur: NEGATIVE
Leukocytes, UA: NEGATIVE
NITRITE: NEGATIVE
PROTEIN: NEGATIVE
Specific Gravity, Urine: 1.02 (ref 1.001–1.035)
pH: 8 (ref 5.0–8.0)

## 2015-07-06 LAB — VITAMIN D 25 HYDROXY (VIT D DEFICIENCY, FRACTURES): VIT D 25 HYDROXY: 73 ng/mL (ref 30–100)

## 2015-07-06 LAB — HEPATIC FUNCTION PANEL
ALBUMIN: 4.1 g/dL (ref 3.6–5.1)
ALT: 16 U/L (ref 6–29)
AST: 19 U/L (ref 10–35)
Alkaline Phosphatase: 73 U/L (ref 33–130)
BILIRUBIN DIRECT: 0.2 mg/dL (ref <=0.2)
BILIRUBIN INDIRECT: 0.3 mg/dL (ref 0.2–1.2)
TOTAL PROTEIN: 6.5 g/dL (ref 6.1–8.1)
Total Bilirubin: 0.5 mg/dL (ref 0.2–1.2)

## 2015-07-06 LAB — BASIC METABOLIC PANEL WITH GFR
BUN: 14 mg/dL (ref 7–25)
CO2: 34 mmol/L — ABNORMAL HIGH (ref 20–31)
CREATININE: 0.86 mg/dL (ref 0.50–0.99)
Calcium: 10.6 mg/dL — ABNORMAL HIGH (ref 8.6–10.4)
Chloride: 102 mmol/L (ref 98–110)
GFR, EST NON AFRICAN AMERICAN: 71 mL/min (ref >=60)
GFR, Est African American: 82 mL/min (ref >=60)
GLUCOSE: 144 mg/dL — AB (ref 65–99)
Potassium: 4.5 mmol/L (ref 3.5–5.3)
Sodium: 143 mmol/L (ref 135–146)

## 2015-07-06 LAB — MICROALBUMIN / CREATININE URINE RATIO
Creatinine, Urine: 180.8 mg/dL
Microalb Creat Ratio: 8.3 mg/g (ref 0.0–30.0)
Microalb, Ur: 1.5 mg/dL (ref <2.0)

## 2015-07-06 LAB — MAGNESIUM: MAGNESIUM: 1.8 mg/dL (ref 1.5–2.5)

## 2015-07-06 LAB — LIPID PANEL
CHOL/HDL RATIO: 2.2 ratio (ref <=5.0)
Cholesterol: 103 mg/dL — ABNORMAL LOW (ref 125–200)
HDL: 47 mg/dL (ref >=46)
LDL CALC: 28 mg/dL (ref <130)
TRIGLYCERIDES: 138 mg/dL (ref <150)
VLDL: 28 mg/dL (ref <30)

## 2015-07-06 LAB — HEMOGLOBIN A1C
Hgb A1c MFr Bld: 8.5 % — ABNORMAL HIGH (ref <5.7)
Mean Plasma Glucose: 197 mg/dL — ABNORMAL HIGH (ref <117)

## 2015-07-06 LAB — INSULIN, FASTING: INSULIN FASTING, SERUM: 33.1 u[IU]/mL — AB (ref 2.0–19.6)

## 2015-07-06 LAB — TSH: TSH: 1.048 u[IU]/mL (ref 0.350–4.500)

## 2015-07-18 ENCOUNTER — Inpatient Hospital Stay: Admission: RE | Admit: 2015-07-18 | Payer: Medicare Other | Source: Ambulatory Visit

## 2015-07-19 ENCOUNTER — Other Ambulatory Visit: Payer: Self-pay | Admitting: Gastroenterology

## 2015-08-09 ENCOUNTER — Ambulatory Visit: Payer: Self-pay | Admitting: Internal Medicine

## 2015-08-21 ENCOUNTER — Other Ambulatory Visit: Payer: Self-pay | Admitting: Internal Medicine

## 2015-08-28 ENCOUNTER — Other Ambulatory Visit: Payer: Self-pay | Admitting: Physician Assistant

## 2015-09-02 ENCOUNTER — Other Ambulatory Visit: Payer: Self-pay | Admitting: Physician Assistant

## 2015-09-02 ENCOUNTER — Other Ambulatory Visit: Payer: Self-pay | Admitting: Internal Medicine

## 2015-09-27 ENCOUNTER — Other Ambulatory Visit: Payer: Self-pay

## 2015-09-27 MED ORDER — QUINAPRIL-HYDROCHLOROTHIAZIDE 20-12.5 MG PO TABS: ORAL_TABLET | ORAL | Status: DC

## 2015-09-30 ENCOUNTER — Other Ambulatory Visit: Payer: Self-pay | Admitting: Internal Medicine

## 2015-10-04 ENCOUNTER — Other Ambulatory Visit: Payer: Self-pay | Admitting: Physician Assistant

## 2015-10-16 ENCOUNTER — Encounter: Payer: Self-pay | Admitting: Internal Medicine

## 2015-10-16 ENCOUNTER — Ambulatory Visit (INDEPENDENT_AMBULATORY_CARE_PROVIDER_SITE_OTHER): Payer: Medicare Other | Admitting: Internal Medicine

## 2015-10-16 VITALS — BP 114/76 | HR 60 | Temp 97.5°F | Resp 16 | Ht 63.0 in | Wt 183.0 lb

## 2015-10-16 DIAGNOSIS — Z6832 Body mass index (BMI) 32.0-32.9, adult: Secondary | ICD-10-CM | POA: Diagnosis not present

## 2015-10-16 DIAGNOSIS — E559 Vitamin D deficiency, unspecified: Secondary | ICD-10-CM | POA: Diagnosis not present

## 2015-10-16 DIAGNOSIS — E1121 Type 2 diabetes mellitus with diabetic nephropathy: Secondary | ICD-10-CM

## 2015-10-16 DIAGNOSIS — I10 Essential (primary) hypertension: Secondary | ICD-10-CM

## 2015-10-16 DIAGNOSIS — E785 Hyperlipidemia, unspecified: Secondary | ICD-10-CM | POA: Diagnosis not present

## 2015-10-16 DIAGNOSIS — Z79899 Other long term (current) drug therapy: Secondary | ICD-10-CM

## 2015-10-16 DIAGNOSIS — E119 Type 2 diabetes mellitus without complications: Secondary | ICD-10-CM | POA: Insufficient documentation

## 2015-10-16 LAB — CBC WITH DIFFERENTIAL/PLATELET
BASOS ABS: 0 10*3/uL (ref 0.0–0.1)
Basophils Relative: 0 % (ref 0–1)
Eosinophils Absolute: 0.2 10*3/uL (ref 0.0–0.7)
Eosinophils Relative: 3 % (ref 0–5)
HEMATOCRIT: 35.7 % — AB (ref 36.0–46.0)
HEMOGLOBIN: 11 g/dL — AB (ref 12.0–15.0)
LYMPHS PCT: 26 % (ref 12–46)
Lymphs Abs: 1.9 10*3/uL (ref 0.7–4.0)
MCH: 25.8 pg — ABNORMAL LOW (ref 26.0–34.0)
MCHC: 30.8 g/dL (ref 30.0–36.0)
MCV: 83.6 fL (ref 78.0–100.0)
MONO ABS: 0.5 10*3/uL (ref 0.1–1.0)
MPV: 10.6 fL (ref 8.6–12.4)
Monocytes Relative: 7 % (ref 3–12)
NEUTROS ABS: 4.7 10*3/uL (ref 1.7–7.7)
NEUTROS PCT: 64 % (ref 43–77)
Platelets: 260 10*3/uL (ref 150–400)
RBC: 4.27 MIL/uL (ref 3.87–5.11)
RDW: 15 % (ref 11.5–15.5)
WBC: 7.3 10*3/uL (ref 4.0–10.5)

## 2015-10-16 LAB — HEPATIC FUNCTION PANEL
ALK PHOS: 64 U/L (ref 33–130)
ALT: 20 U/L (ref 6–29)
AST: 26 U/L (ref 10–35)
Albumin: 4 g/dL (ref 3.6–5.1)
BILIRUBIN DIRECT: 0.2 mg/dL (ref <=0.2)
BILIRUBIN INDIRECT: 0.4 mg/dL (ref 0.2–1.2)
TOTAL PROTEIN: 6.2 g/dL (ref 6.1–8.1)
Total Bilirubin: 0.6 mg/dL (ref 0.2–1.2)

## 2015-10-16 LAB — BASIC METABOLIC PANEL WITH GFR
BUN: 16 mg/dL (ref 7–25)
CHLORIDE: 104 mmol/L (ref 98–110)
CO2: 29 mmol/L (ref 20–31)
Calcium: 10.4 mg/dL (ref 8.6–10.4)
Creat: 0.95 mg/dL (ref 0.50–0.99)
GFR, EST NON AFRICAN AMERICAN: 63 mL/min (ref >=60)
GFR, Est African American: 73 mL/min (ref >=60)
GLUCOSE: 100 mg/dL — AB (ref 65–99)
POTASSIUM: 4.6 mmol/L (ref 3.5–5.3)
SODIUM: 142 mmol/L (ref 135–146)

## 2015-10-16 LAB — LIPID PANEL
CHOL/HDL RATIO: 2.5 ratio (ref <=5.0)
Cholesterol: 112 mg/dL — ABNORMAL LOW (ref 125–200)
HDL: 44 mg/dL — ABNORMAL LOW (ref >=46)
LDL CALC: 41 mg/dL (ref <130)
Triglycerides: 135 mg/dL (ref <150)
VLDL: 27 mg/dL (ref <30)

## 2015-10-16 LAB — MAGNESIUM: Magnesium: 1.5 mg/dL (ref 1.5–2.5)

## 2015-10-16 NOTE — Patient Instructions (Signed)

## 2015-10-17 ENCOUNTER — Encounter: Payer: Self-pay | Admitting: Internal Medicine

## 2015-10-17 DIAGNOSIS — Z6832 Body mass index (BMI) 32.0-32.9, adult: Secondary | ICD-10-CM | POA: Insufficient documentation

## 2015-10-17 LAB — HEMOGLOBIN A1C
Hgb A1c MFr Bld: 7.1 % — ABNORMAL HIGH (ref <5.7)
Mean Plasma Glucose: 157 mg/dL — ABNORMAL HIGH (ref <117)

## 2015-10-17 LAB — VITAMIN D 25 HYDROXY (VIT D DEFICIENCY, FRACTURES): VIT D 25 HYDROXY: 83 ng/mL (ref 30–100)

## 2015-10-17 LAB — INSULIN, RANDOM: INSULIN: 24.5 u[IU]/mL — AB (ref 2.0–19.6)

## 2015-10-17 LAB — TSH: TSH: 1.072 u[IU]/mL (ref 0.350–4.500)

## 2015-10-17 NOTE — Progress Notes (Signed)
Patient ID: [name], female   DOB: 1951-06-08, 64 y.o.   MRN: [id number]   This very nice 64 y.o. DWF presents for 3 month follow up with Hypertension, Hyperlipidemia, Pre-Diabetes and Vitamin D Deficiency. Admits being under a lot of stress as she has a son with terminal primary liver cancer moved in with her.    Patient is treated for HTN & BP has been controlled at home. Today's BP: 114/76 mmHg. Patient has had no complaints of any cardiac type chest pain, palpitations, dyspnea/orthopnea/PND, dizziness, claudication, or dependent edema.   Hyperlipidemia is controlled with diet & meds. Patient denies myalgias or other med SE's. Last Lipids were at goal with Cholesterol 112*; HDL 44*; LDL 41; Triglycerides 135 on 10/16/2015.    Also, the patient has history of T2_NIDDM w/Stage 2 CKD and has had no symptoms of reactive hypoglycemia, diabetic polys, paresthesias or visual blurring. She reports FBG's range betw 105-115 mg%.  Last A1c was not at goal with A1c 8.5% on 07/05/2015.    Further, the patient also has history of Vitamin D Deficiency and supplements vitamin D without any suspected side-effects. Last vitamin D was 73 on 07/05/2015.  Medication Sig  . ALPRAZolam  0.5 MG TAKE 1/2 OR 1 TABLET BY MOUTH 3 TIMES A DAY AS NEEDED *GREEN[employer]ONE BRAND ONLY*  . atenolol TENORMIN 50 MG  TAKE 1 TABLET BY MOUTH EVERY DAY FOR BP & PULSE RATE  . VITAMIN D  Take 2,000 Units by mouth 3 (three) times daily.  . citalopram  40 MG  TAKE 1 TABLET EVERY DAY  . Magnesium 250 MG  Take 250 mg by mouth daily.  . metFORMIN-XR 500 MG  TAKE 1 TO 2 TABLETS BY MOUTH TWICE DAILY WITH FOOD  . Multiple Vitamins-Minerals  Take by mouth daily.  . quinapril-hctz  20-12.5 MG TAKE 2 TABLETS BY MOUTH EVERY DAY FOR BLOOD PRESSURE  . rosuvastatin  20 MG  TAKE 1 TABLET (20 MG TOTAL) BY MOUTH DAILY.  Marland Kitchen aspirin EC 325 MG  Take 325 mg by mouth once.   Allergies  Allergen Reactions  . Other Other (See Comments)    Unknown  anesthetic; unknown reaction; Anesthetic was given during back surgery   PMHx:   Past Medical History  Diagnosis Date  . Hypertension   . Hyperlipidemia   . Fibromyalgia   . Back pain   . Diabetes mellitus without complication (HCC)   . Type II or unspecified type diabetes mellitus without mention of complication, not stated as uncontrolled   . History of kidney stones   . Vitamin D deficiency    Immunization History  Administered Date(s) Administered  . Influenza Split 08/17/2013, 09/04/2014  . Influenza, High Dose Seasonal PF 08/28/2015  . Pneumococcal Conjugate-13 05/29/2014  . Td 05/06/2006  . Tdap 09/04/2014   Past Surgical History  Procedure Laterality Date  . Intramedullary (im) nail intertrochanteric Left 09/29/2013    Procedure: INTRAMEDULLARY (IM) NAIL INTERTROCHANTRIC;  Surgeon: Mable Paris, MD;  Location: [facility] OR;  Service: Orthopedics;  Laterality: Left;   FHx:    Reviewed / unchanged  SHx:    Reviewed / unchanged  Systems Review:  Constitutional: Denies fever, chills, wt changes, headaches, insomnia, fatigue, night sweats, change in appetite. Eyes: Denies redness, blurred vision, diplopia, discharge, itchy, watery eyes.  ENT: Denies discharge, congestion, post nasal drip, epistaxis, sore throat, earache, hearing loss, dental pain, tinnitus, vertigo, sinus pain, snoring.  CV: Denies chest pain, palpitations, irregular heartbeat, syncope, dyspnea,  diaphoresis, orthopnea, PND, claudication or edema. Respiratory: denies cough, dyspnea, DOE, pleurisy, hoarseness, laryngitis, wheezing.  Gastrointestinal: Denies dysphagia, odynophagia, heartburn, reflux, water brash, abdominal pain or cramps, nausea, vomiting, bloating, diarrhea, constipation, hematemesis, melena, hematochezia  or hemorrhoids. Genitourinary: Denies dysuria, frequency, urgency, nocturia, hesitancy, discharge, hematuria or flank pain. Musculoskeletal: Denies arthralgias, myalgias, stiffness, jt.  swelling, pain, limping or strain/sprain.  Skin: Denies pruritus, rash, hives, warts, acne, eczema or change in skin lesion(s). Neuro: No weakness, tremor, incoordination, spasms, paresthesia or pain. Psychiatric: Denies confusion, memory loss or sensory loss. Endo: Denies change in weight, skin or hair change.  Heme/Lymph: No excessive bleeding, bruising or enlarged lymph nodes.  Physical Exam  BP 114/76 mmHg  Pulse 60  Temp(Src) 97.5 F (36.4 C)  Resp 16  Ht 5\' 3"  (1.6 m)  Wt 183 lb (83.008 kg)  BMI 32.43 kg/m2  Appears well nourished and in no distress. Eyes: PERRLA, EOMs, conjunctiva no swelling or erythema. Sinuses: No frontal/maxillary tenderness ENT/Mouth: EAC's clear, TM's nl w/o erythema, bulging. Nares clear w/o erythema, swelling, exudates. Oropharynx clear without erythema or exudates. Oral hygiene is good. Tongue normal, non obstructing. Hearing intact.  Neck: Supple. Thyroid nl. Car 2+/2+ without bruits, nodes or JVD. Chest: Respirations nl with BS clear & equal w/o rales, rhonchi, wheezing or stridor.  Cor: Heart sounds normal w/ regular rate and rhythm without sig. murmurs, gallops, clicks, or rubs. Peripheral pulses normal and equal  without edema.  Abdomen: Soft & bowel sounds normal. Non-tender w/o guarding, rebound, hernias, masses, or organomegaly.  Lymphatics: Unremarkable.  Musculoskeletal: Full ROM all peripheral extremities, joint stability, 5/5 strength  and normal gait.  Skin: Warm, dry without exposed rashes, lesions or ecchymosis apparent.  Neuro: Cranial nerves intact, reflexes equal bilaterally. Sensory-motor testing grossly intact. Tendon reflexes grossly intact.  Pysch: Alert & oriented x 3.  Insight and judgement nl & appropriate. No ideations.  Assessment and Plan:  1. Essential hypertension  - TSH  2. Hyperlipidemia  - Lipid panel - TSH  3. Controlled type 2 diabetes mellitus with diabetic nephropathy, without long-term current use of  insulin (HCC)  - Hemoglobin A1c - Insulin, random  4. Vitamin D deficiency  - VITAMIN D 25 Hydroxy (Vit-D Deficiency, Fractures)  5. Medication management  - CBC with Differential/Platelet - BASIC METABOLIC PANEL WITH GFR - Hepatic function panel - Magnesium  6. BMI 32.0-32.9,adult   Recommended regular exercise, BP monitoring, weight control, and discussed med and SE's. Recommended labs to assess and monitor clinical status. Further disposition pending results of labs. Over 30 minutes of exam, counseling, chart review was performed

## 2015-11-05 ENCOUNTER — Other Ambulatory Visit: Payer: Self-pay | Admitting: Physician Assistant

## 2015-11-05 DIAGNOSIS — IMO0001 Reserved for inherently not codable concepts without codable children: Secondary | ICD-10-CM

## 2015-11-06 ENCOUNTER — Other Ambulatory Visit: Payer: Self-pay | Admitting: Physician Assistant

## 2015-11-15 ENCOUNTER — Other Ambulatory Visit: Payer: Self-pay | Admitting: Internal Medicine

## 2015-12-30 ENCOUNTER — Other Ambulatory Visit: Payer: Self-pay | Admitting: Internal Medicine

## 2016-01-17 ENCOUNTER — Ambulatory Visit (INDEPENDENT_AMBULATORY_CARE_PROVIDER_SITE_OTHER): Payer: Medicare Other | Admitting: Internal Medicine

## 2016-01-17 ENCOUNTER — Other Ambulatory Visit: Payer: Self-pay | Admitting: *Deleted

## 2016-01-17 ENCOUNTER — Encounter: Payer: Self-pay | Admitting: Internal Medicine

## 2016-01-17 VITALS — BP 130/88 | HR 68 | Temp 97.7°F | Resp 16 | Ht 63.0 in | Wt 185.0 lb

## 2016-01-17 DIAGNOSIS — E559 Vitamin D deficiency, unspecified: Secondary | ICD-10-CM

## 2016-01-17 DIAGNOSIS — Z79899 Other long term (current) drug therapy: Secondary | ICD-10-CM | POA: Diagnosis not present

## 2016-01-17 DIAGNOSIS — E1122 Type 2 diabetes mellitus with diabetic chronic kidney disease: Secondary | ICD-10-CM | POA: Diagnosis not present

## 2016-01-17 DIAGNOSIS — E785 Hyperlipidemia, unspecified: Secondary | ICD-10-CM

## 2016-01-17 DIAGNOSIS — I10 Essential (primary) hypertension: Secondary | ICD-10-CM | POA: Diagnosis not present

## 2016-01-17 DIAGNOSIS — N182 Chronic kidney disease, stage 2 (mild): Secondary | ICD-10-CM | POA: Diagnosis not present

## 2016-01-17 MED ORDER — ROSUVASTATIN CALCIUM 20 MG PO TABS: ORAL_TABLET | ORAL | Status: DC

## 2016-01-17 NOTE — Patient Instructions (Signed)

## 2016-01-17 NOTE — Progress Notes (Signed)
Patient ID: [name], female   DOB: Apr 22, 1951, 65 y.o.   MRN: [id number]   This very nice 65 y.o. DWF presents for 3 month follow up with Hypertension, Hyperlipidemia, Pre-Diabetes and Vitamin D Deficiency.    Patient is treated for HTN & BP has been controlled at home. Today's BP: 130/88 mmHg. Patient has had no complaints of any cardiac type chest pain, palpitations, dyspnea/orthopnea/PND, dizziness, claudication, or dependent edema.   Hyperlipidemia is controlled with diet & meds. Patient denies myalgias or other med SE's. Last Lipids were 10/16/2015: Cholesterol 112*; HDL 44*; LDL Cholesterol 41; Triglycerides 135 on    Also, the patient has history of Morbid Obesity (BMI 32+) and consequent T2_NIDDM w/ CKD2 since 2006 and has had no symptoms of reactive hypoglycemia, diabetic polys, paresthesias or visual blurring.  She reports FBG's range <115 mg%. Last A1c was 7.1% on 10/16/2015.    Further, the patient also has history of Vitamin D Deficiency and supplements vitamin D without any suspected side-effects. Last vitamin D was 83 on 10/16/2015.  Medication Sig  . ALPRAZolam  0.5 MG  TAKE 1/2 TO 1 TABLET 3 TIMES A DAY AS NEEDED  . aspirin EC 81 MG  Take 81 mg by mouth daily.  Marland Kitchen atenolol 50 MG  TAKE 1 TABLET BY MOUTH EVERY DAY FOR BP & PULSE RATE  . VITAMIN D  Take 2,000 Units by mouth 3 (three) times daily.  . citalopram  40 MG TAKE 1 TABLET EVERY DAY  . Magnesium 250 MG  Take 250 mg by mouth daily.  . metFORMIN-XR 500 MG  TAKE 1 TO 2 TABLETS BY MOUTH TWICE DAILY WITH FOOD  . Multiple Vitamins-Minerals  Take by mouth daily.  . quinapril-hctz 20-12.5 MG TAKE 2 TABLETS BY MOUTH EVERY DAY FOR BLOOD PRESSURE  . rosuvastatin  20 MG  TAKE 1 TABLET (20 MG TOTAL) BY MOUTH DAILY. - currently off her crestor  . citalopram  40 MG TAKE 1 TABLET EVERY DAY   Allergies  Allergen Reactions  . Other Other (See Comments)    Unknown anesthetic; unknown reaction; Anesthetic was given during back  surgery   PMHx:   Past Medical History  Diagnosis Date  . Hypertension   . Hyperlipidemia   . Fibromyalgia   . Back pain   . Diabetes mellitus without complication (HCC)   . Type II or unspecified type diabetes mellitus without mention of complication, not stated as uncontrolled   . History of kidney stones   . Vitamin D deficiency    Immunization History  Administered Date(s) Administered  . Influenza Split 08/17/2013, 09/04/2014  . Influenza, High Dose Seasonal PF 08/28/2015  . Pneumococcal Conjugate-13 05/29/2014  . Td 05/06/2006  . Tdap 09/04/2014   Past Surgical History  Procedure Laterality Date  . Intramedullary (im) nail intertrochanteric Left 09/29/2013    Procedure: INTRAMEDULLARY (IM) NAIL INTERTROCHANTRIC;  Surgeon: Mable Paris, MD;  [facility]: [facility];  Service: Orthopedics;  Laterality: Left;   FHx:    Reviewed / unchanged  SHx:    Reviewed / unchanged  Systems Review:  Constitutional: Denies fever, chills, wt changes, headaches, insomnia, fatigue, night sweats, change in appetite. Eyes: Denies redness, blurred vision, diplopia, discharge, itchy, watery eyes.  ENT: Denies discharge, congestion, post nasal drip, epistaxis, sore throat, earache, hearing loss, dental pain, tinnitus, vertigo, sinus pain, snoring.  CV: Denies chest pain, palpitations, irregular heartbeat, syncope, dyspnea, diaphoresis, orthopnea, PND, claudication or edema. Respiratory: denies cough, dyspnea, DOE, pleurisy, hoarseness,  laryngitis, wheezing.  Gastrointestinal: Denies dysphagia, odynophagia, heartburn, reflux, water brash, abdominal pain or cramps, nausea, vomiting, bloating, diarrhea, constipation, hematemesis, melena, hematochezia  or hemorrhoids. Genitourinary: Denies dysuria, frequency, urgency, nocturia, hesitancy, discharge, hematuria or flank pain. Musculoskeletal: Denies arthralgias, myalgias, stiffness, jt. swelling, pain, limping or strain/sprain.  Skin: Denies  pruritus, rash, hives, warts, acne, eczema or change in skin lesion(s). Neuro: No weakness, tremor, incoordination, spasms, paresthesia or pain. Psychiatric: Denies confusion, memory loss or sensory loss. Endo: Denies change in weight, skin or hair change.  Heme/Lymph: No excessive bleeding, bruising or enlarged lymph nodes.  Physical Exam  BP 130/88 mmHg  Pulse 68  Temp(Src) 97.7 F (36.5 C)  Resp 16  Ht  (1.6 m)  Wt 185 lb (83.915 kg)  BMI 32.78 kg/m2  Appears over nourished w/central obesity and in no distress. Eyes: PERRLA, EOMs, conjunctiva no swelling or erythema. Sinuses: No frontal/maxillary tenderness ENT/Mouth: EAC's clear, TM's nl w/o erythema, bulging. Nares clear w/o erythema, swelling, exudates. Oropharynx clear without erythema or exudates. Oral hygiene is good. Tongue normal, non obstructing. Hearing intact.  Neck: Supple. Thyroid nl. Car 2+/2+ without bruits, nodes or JVD. Chest: Respirations nl with BS clear & equal w/o rales, rhonchi, wheezing or stridor.  Cor: Heart sounds normal w/ regular rate and rhythm without sig. murmurs, gallops, clicks, or rubs. Peripheral pulses normal and equal  without edema.  Abdomen: Soft & bowel sounds normal. Non-tender w/o guarding, rebound, hernias, masses, or organomegaly.  Lymphatics: Unremarkable.  Musculoskeletal: Full ROM all peripheral extremities, joint stability, 5/5 strength, and normal gait.  Skin: Warm, dry without exposed rashes, lesions or ecchymosis apparent.  Neuro: Cranial nerves intact, reflexes equal bilaterally. Sensory-motor testing grossly intact. Tendon reflexes grossly intact.  Pysch: Alert & oriented x 3.  Insight and judgement nl & appropriate. No ideations.  Assessment and Plan:  1. Essential hypertension  - TSH  2. Hyperlipidemia  - Lipid panel - TSH  3. T2_NIDDM with CKD 2 (HCC)  - Hemoglobin A1c - Insulin, random  4. Vitamin D deficiency  - VITAMIN D 25 Hydroxy   5. Medication  management  - CBC with Differential/Platelet - BASIC METABOLIC PANEL WITH GFR - Hepatic function panel - Magnesium   Recommended regular exercise, BP monitoring, weight control, and discussed med and SE's. Recommended labs to assess and monitor clinical status. Further disposition pending results of labs. Over 30 minutes of exam, counseling, chart review was performed

## 2016-01-18 LAB — HEPATIC FUNCTION PANEL
ALBUMIN: 4.3 g/dL (ref 3.6–5.1)
ALK PHOS: 78 U/L (ref 33–130)
ALT: 23 U/L (ref 6–29)
AST: 28 U/L (ref 10–35)
BILIRUBIN TOTAL: 0.5 mg/dL (ref 0.2–1.2)
Bilirubin, Direct: 0.1 mg/dL (ref <=0.2)
Indirect Bilirubin: 0.4 mg/dL (ref 0.2–1.2)
Total Protein: 6.8 g/dL (ref 6.1–8.1)

## 2016-01-18 LAB — CBC WITH DIFFERENTIAL/PLATELET
BASOS ABS: 0 10*3/uL (ref 0.0–0.1)
BASOS PCT: 0 % (ref 0–1)
EOS ABS: 0.2 10*3/uL (ref 0.0–0.7)
EOS PCT: 3 % (ref 0–5)
HCT: 37.3 % (ref 36.0–46.0)
Hemoglobin: 11.5 g/dL — ABNORMAL LOW (ref 12.0–15.0)
Lymphocytes Relative: 33 % (ref 12–46)
Lymphs Abs: 1.7 10*3/uL (ref 0.7–4.0)
MCH: 25.4 pg — ABNORMAL LOW (ref 26.0–34.0)
MCHC: 30.8 g/dL (ref 30.0–36.0)
MCV: 82.5 fL (ref 78.0–100.0)
MPV: 10.6 fL (ref 8.6–12.4)
Monocytes Absolute: 0.5 10*3/uL (ref 0.1–1.0)
Monocytes Relative: 9 % (ref 3–12)
NEUTROS PCT: 55 % (ref 43–77)
Neutro Abs: 2.8 10*3/uL (ref 1.7–7.7)
PLATELETS: 307 10*3/uL (ref 150–400)
RBC: 4.52 MIL/uL (ref 3.87–5.11)
RDW: 15.7 % — AB (ref 11.5–15.5)
WBC: 5.1 10*3/uL (ref 4.0–10.5)

## 2016-01-18 LAB — HEMOGLOBIN A1C
Hgb A1c MFr Bld: 7.5 % — ABNORMAL HIGH (ref <5.7)
Mean Plasma Glucose: 169 mg/dL — ABNORMAL HIGH (ref <117)

## 2016-01-18 LAB — LIPID PANEL
CHOL/HDL RATIO: 5.5 ratio — AB (ref <=5.0)
Cholesterol: 240 mg/dL — ABNORMAL HIGH (ref 125–200)
HDL: 44 mg/dL — AB (ref >=46)
LDL CALC: 157 mg/dL — AB (ref <130)
Triglycerides: 193 mg/dL — ABNORMAL HIGH (ref <150)
VLDL: 39 mg/dL — ABNORMAL HIGH (ref <30)

## 2016-01-18 LAB — TSH: TSH: 1.48 m[IU]/L

## 2016-01-18 LAB — BASIC METABOLIC PANEL WITH GFR
BUN: 26 mg/dL — AB (ref 7–25)
CHLORIDE: 98 mmol/L (ref 98–110)
CO2: 27 mmol/L (ref 20–31)
Calcium: 10.1 mg/dL (ref 8.6–10.4)
Creat: 0.9 mg/dL (ref 0.50–0.99)
GFR, Est African American: 77 mL/min (ref >=60)
GFR, Est Non African American: 67 mL/min (ref >=60)
Glucose, Bld: 157 mg/dL — ABNORMAL HIGH (ref 65–99)
POTASSIUM: 4.3 mmol/L (ref 3.5–5.3)
Sodium: 138 mmol/L (ref 135–146)

## 2016-01-18 LAB — INSULIN, RANDOM: INSULIN: 50.4 u[IU]/mL — AB (ref 2.0–19.6)

## 2016-01-18 LAB — MAGNESIUM: Magnesium: 1.7 mg/dL (ref 1.5–2.5)

## 2016-01-18 LAB — VITAMIN D 25 HYDROXY (VIT D DEFICIENCY, FRACTURES): Vit D, 25-Hydroxy: 69 ng/mL (ref 30–100)

## 2016-03-01 ENCOUNTER — Other Ambulatory Visit: Payer: Self-pay | Admitting: Physician Assistant

## 2016-03-07 ENCOUNTER — Other Ambulatory Visit: Payer: Self-pay | Admitting: Physician Assistant

## 2016-04-17 ENCOUNTER — Ambulatory Visit (INDEPENDENT_AMBULATORY_CARE_PROVIDER_SITE_OTHER): Payer: Medicare Other | Admitting: Internal Medicine

## 2016-04-17 ENCOUNTER — Ambulatory Visit: Payer: Self-pay | Admitting: Internal Medicine

## 2016-04-17 ENCOUNTER — Encounter: Payer: Self-pay | Admitting: Internal Medicine

## 2016-04-17 VITALS — BP 110/64 | HR 58 | Temp 98.2°F | Resp 16 | Ht 63.0 in | Wt 182.0 lb

## 2016-04-17 DIAGNOSIS — E669 Obesity, unspecified: Secondary | ICD-10-CM

## 2016-04-17 DIAGNOSIS — Z79899 Other long term (current) drug therapy: Secondary | ICD-10-CM

## 2016-04-17 DIAGNOSIS — E1122 Type 2 diabetes mellitus with diabetic chronic kidney disease: Secondary | ICD-10-CM | POA: Diagnosis not present

## 2016-04-17 DIAGNOSIS — F329 Major depressive disorder, single episode, unspecified: Secondary | ICD-10-CM

## 2016-04-17 DIAGNOSIS — Z6832 Body mass index (BMI) 32.0-32.9, adult: Secondary | ICD-10-CM

## 2016-04-17 DIAGNOSIS — E559 Vitamin D deficiency, unspecified: Secondary | ICD-10-CM

## 2016-04-17 DIAGNOSIS — Z87442 Personal history of urinary calculi: Secondary | ICD-10-CM | POA: Diagnosis not present

## 2016-04-17 DIAGNOSIS — I10 Essential (primary) hypertension: Secondary | ICD-10-CM | POA: Diagnosis not present

## 2016-04-17 DIAGNOSIS — Z0000 Encounter for general adult medical examination without abnormal findings: Secondary | ICD-10-CM

## 2016-04-17 DIAGNOSIS — E113599 Type 2 diabetes mellitus with proliferative diabetic retinopathy without macular edema, unspecified eye: Secondary | ICD-10-CM

## 2016-04-17 DIAGNOSIS — Z23 Encounter for immunization: Secondary | ICD-10-CM

## 2016-04-17 DIAGNOSIS — N182 Chronic kidney disease, stage 2 (mild): Secondary | ICD-10-CM | POA: Diagnosis not present

## 2016-04-17 DIAGNOSIS — E785 Hyperlipidemia, unspecified: Secondary | ICD-10-CM

## 2016-04-17 DIAGNOSIS — F32A Depression, unspecified: Secondary | ICD-10-CM

## 2016-04-17 MED ORDER — BUPROPION HCL ER (XL) 150 MG PO TB24: 150.0000 mg | ORAL_TABLET | ORAL | Status: DC

## 2016-04-17 MED ORDER — LOSARTAN POTASSIUM-HCTZ 100-25 MG PO TABS: 1.0000 | ORAL_TABLET | Freq: Every day | ORAL | Status: DC
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
## 2016-05-06 ENCOUNTER — Other Ambulatory Visit: Payer: Self-pay | Admitting: Internal Medicine

## 2016-05-15 ENCOUNTER — Other Ambulatory Visit: Payer: Self-pay | Admitting: Internal Medicine

## 2016-06-04 ENCOUNTER — Ambulatory Visit (INDEPENDENT_AMBULATORY_CARE_PROVIDER_SITE_OTHER): Payer: Medicare Other | Admitting: Internal Medicine

## 2016-06-04 ENCOUNTER — Encounter: Payer: Self-pay | Admitting: Internal Medicine

## 2016-06-04 VITALS — BP 126/64 | HR 60 | Temp 98.0°F | Resp 16 | Ht 63.0 in | Wt 182.0 lb

## 2016-06-04 DIAGNOSIS — E559 Vitamin D deficiency, unspecified: Secondary | ICD-10-CM

## 2016-06-04 DIAGNOSIS — F322 Major depressive disorder, single episode, severe without psychotic features: Secondary | ICD-10-CM | POA: Diagnosis not present

## 2016-06-04 DIAGNOSIS — I10 Essential (primary) hypertension: Secondary | ICD-10-CM | POA: Diagnosis not present

## 2016-06-04 DIAGNOSIS — N182 Chronic kidney disease, stage 2 (mild): Secondary | ICD-10-CM | POA: Diagnosis not present

## 2016-06-04 DIAGNOSIS — E785 Hyperlipidemia, unspecified: Secondary | ICD-10-CM | POA: Diagnosis not present

## 2016-06-04 DIAGNOSIS — Z79899 Other long term (current) drug therapy: Secondary | ICD-10-CM

## 2016-06-04 DIAGNOSIS — E1122 Type 2 diabetes mellitus with diabetic chronic kidney disease: Secondary | ICD-10-CM | POA: Diagnosis not present

## 2016-06-04 LAB — CBC WITH DIFFERENTIAL/PLATELET
BASOS ABS: 0 {cells}/uL (ref 0–200)
Basophils Relative: 0 %
EOS ABS: 132 {cells}/uL (ref 15–500)
EOS PCT: 2 %
HCT: 34 % — ABNORMAL LOW (ref 35.0–45.0)
Hemoglobin: 10.4 g/dL — ABNORMAL LOW (ref 11.7–15.5)
LYMPHS PCT: 25 %
Lymphs Abs: 1650 cells/uL (ref 850–3900)
MCH: 25.6 pg — AB (ref 27.0–33.0)
MCHC: 30.6 g/dL — AB (ref 32.0–36.0)
MCV: 83.5 fL (ref 80.0–100.0)
MONOS PCT: 8 %
MPV: 10.2 fL (ref 7.5–12.5)
Monocytes Absolute: 528 cells/uL (ref 200–950)
NEUTROS ABS: 4290 {cells}/uL (ref 1500–7800)
NEUTROS PCT: 65 %
PLATELETS: 274 10*3/uL (ref 140–400)
RBC: 4.07 MIL/uL (ref 3.80–5.10)
RDW: 15.8 % — AB (ref 11.0–15.0)
WBC: 6.6 10*3/uL (ref 3.8–10.8)

## 2016-06-04 LAB — BASIC METABOLIC PANEL WITH GFR
BUN: 23 mg/dL (ref 7–25)
CALCIUM: 10.4 mg/dL (ref 8.6–10.4)
CHLORIDE: 104 mmol/L (ref 98–110)
CO2: 27 mmol/L (ref 20–31)
CREATININE: 1.43 mg/dL — AB (ref 0.50–0.99)
GFR, EST AFRICAN AMERICAN: 44 mL/min — AB (ref >=60)
GFR, Est Non African American: 38 mL/min — ABNORMAL LOW (ref >=60)
Glucose, Bld: 102 mg/dL — ABNORMAL HIGH (ref 65–99)
Potassium: 4.5 mmol/L (ref 3.5–5.3)
SODIUM: 140 mmol/L (ref 135–146)

## 2016-06-04 LAB — LIPID PANEL
CHOL/HDL RATIO: 2.2 ratio (ref <=5.0)
CHOLESTEROL: 105 mg/dL — AB (ref 125–200)
HDL: 47 mg/dL (ref >=46)
LDL Cholesterol: 33 mg/dL (ref <130)
TRIGLYCERIDES: 126 mg/dL (ref <150)
VLDL: 25 mg/dL (ref <30)

## 2016-06-04 LAB — HEMOGLOBIN A1C
Hgb A1c MFr Bld: 6.9 % — ABNORMAL HIGH (ref <5.7)
MEAN PLASMA GLUCOSE: 151 mg/dL

## 2016-06-04 LAB — HEPATIC FUNCTION PANEL
ALBUMIN: 4.1 g/dL (ref 3.6–5.1)
ALT: 15 U/L (ref 6–29)
AST: 19 U/L (ref 10–35)
Alkaline Phosphatase: 53 U/L (ref 33–130)
BILIRUBIN DIRECT: 0.1 mg/dL (ref <=0.2)
BILIRUBIN TOTAL: 0.5 mg/dL (ref 0.2–1.2)
Indirect Bilirubin: 0.4 mg/dL (ref 0.2–1.2)
Total Protein: 6.3 g/dL (ref 6.1–8.1)

## 2016-06-04 LAB — TSH: TSH: 2.13 mIU/L

## 2016-06-04 MED ORDER — BUPROPION HCL ER (XL) 300 MG PO TB24: 300.0000 mg | ORAL_TABLET | Freq: Every day | ORAL | Status: DC

## 2016-06-04 NOTE — Progress Notes (Signed)
Assessment and Plan:  Hypertension:  -Continue medication -monitor blood pressure at home. -Continue DASH diet -Reminder to go to the ER if any CP, SOB, nausea, dizziness, severe HA, changes vision/speech, left arm numbness and tingling and jaw pain.  Cholesterol - Continue diet and exercise -Check cholesterol.   Diabetes with diabetic chronic kidney disease  -likely needs an increase in meds as diet reports was terrible -Continue diet and exercise.  -Check A1C  Vitamin D Def -check level -continue medications.   Major Depressive Disorder -increased wellbutrin to 300 mg -continue celexa 40 mg -recommend transitioning off xanax  Continue diet and meds as discussed. Further disposition pending results of labs. Discussed med's effects and SE's.    HPI 65 y.o. female  presents for 3 month follow up with hypertension, hyperlipidemia, diabetes and vitamin D deficiency.   Her blood pressure has been controlled at home, today their BP is BP: 126/64 mmHg.She does not workout. She denies chest pain, shortness of breath, dizziness.  She notes that she does think that sometimes she gets high blood pressure based on her feeling.     She is on cholesterol medication and denies myalgias. Her cholesterol is at goal. The cholesterol was:  01/17/2016: Cholesterol 240*; HDL 44*; LDL Cholesterol 157*; Triglycerides 193*.  She is back on her crestor.  She ran out of it.   She has not been working on diet and exercise for diabetes with diabetic chronic kidney disease, she is on bASA, she is on ACE/ARB, and denies  foot ulcerations, hyperglycemia, hypoglycemia , increased appetite, nausea, paresthesia of the feet, polydipsia, polyuria, visual disturbances, vomiting and weight loss. Last A1C was: 01/17/2016: Hgb A1c MFr Bld 7.5*.  She is not checking blood sugars.  She is not working on diet.  She thinks that her blood sugar needs to be high     Patient is on Vitamin D supplement. 01/17/2016: Vit D,  25-Hydroxy 69  Patient reports that she is still very tired and fatigued.  She reports that she is having a tough time because her [place] in [place] being moved to a group home which is farther away.  She also recently lost another son.  She is not interested in therapy.  She thinks wellbutrin has helped a little.    Current Medications:  Current Outpatient Prescriptions on File Prior to Visit  Medication Sig Dispense Refill  . ALPRAZolam (XANAX) 0.5 MG tablet TAKE 1/2 TO 1 TABLET BY MOUTH 3 TIMES DAILY AS NEEDED 90 tablet 3  . aspirin EC 81 MG tablet Take 81 mg by mouth daily.    Marland Kitchen. atenolol (TENORMIN) 50 MG tablet TAKE 1 TABLET BY MOUTH EVERY DAY FOR BP & PULSE RATE 90 tablet 0  . buPROPion (WELLBUTRIN XL) 150 MG 24 hr tablet Take 1 tablet (150 mg total) by mouth every morning. 90 tablet 0  . Cholecalciferol (VITAMIN D PO) Take 2,000 Units by mouth 3 (three) times daily.    . citalopram (CELEXA) 40 MG tablet TAKE 1 TABLET EVERY DAY 90 tablet 1  . losartan-hydrochlorothiazide (HYZAAR) 100-25 MG tablet Take 1 tablet by mouth daily. 30 tablet 11  . Magnesium 250 MG TABS Take 250 mg by mouth daily.    . metFORMIN (GLUCOPHAGE-XR) 500 MG 24 hr tablet TAKE 1 TO 2 TABLETS BY MOUTH TWICE DAILY WITH FOOD 360 tablet 1  . Multiple Vitamins-Minerals (MULTIVITAMIN PO) Take by mouth daily.    . rosuvastatin (CRESTOR) 20 MG tablet TAKE 1 TABLET (20 MG  TOTAL) BY MOUTH DAILY. 90 tablet 1   No current facility-administered medications on file prior to visit.   Medical History:  Past Medical History  Diagnosis Date  . Hypertension   . Hyperlipidemia   . Fibromyalgia   . Back pain   . Diabetes mellitus without complication (HCC)   . Type II or unspecified type diabetes mellitus without mention of complication, not stated as uncontrolled   . History of kidney stones   . Vitamin D deficiency    Allergies:  Allergies  Allergen Reactions  . Other Other (See Comments)    Unknown anesthetic; unknown  reaction; Anesthetic was given during back surgery     Review of Systems:  Review of Systems  Constitutional: Positive for malaise/fatigue. Negative for fever, chills, weight loss and diaphoresis.  HENT: Negative for congestion, ear pain and sore throat.   Respiratory: Negative for cough, shortness of breath and wheezing.   Cardiovascular: Negative for chest pain, palpitations and leg swelling.  Gastrointestinal: Negative for heartburn, abdominal pain, diarrhea, constipation, blood in stool and melena.  Genitourinary: Negative.   Skin: Negative.   Neurological: Negative for dizziness, sensory change, loss of consciousness and headaches.  Psychiatric/Behavioral: Positive for depression. The patient is nervous/anxious and has insomnia.     Family history- Review and unchanged  Social history- Review and unchanged  Physical Exam: BP 126/64 mmHg  Pulse 60  Temp(Src) 98 F (36.7 C) (Temporal)  Resp 16  Ht 5\' 3"  (1.6 m)  Wt 182 lb (82.555 kg)  BMI 32.25 kg/m2 Wt Readings from Last 3 Encounters:  06/04/16 182 lb (82.555 kg)  04/17/16 182 lb (82.555 kg)  01/17/16 185 lb (83.915 kg)   General Appearance: Well nourished well developed, non-toxic appearing, in no apparent distress. Eyes: PERRLA, EOMs, conjunctiva no swelling or erythema ENT/Mouth: Ear canals clear with no erythema, swelling, or discharge.  TMs normal bilaterally, oropharynx clear, moist, with no exudate.   Neck: Supple, thyroid normal, no JVD, no cervical adenopathy.  Respiratory: Respiratory effort normal, breath sounds clear A&P, no wheeze, rhonchi or rales noted.  No retractions, no accessory muscle usage Cardio: RRR with no MRGs. No noted edema.  Abdomen: Soft, + BS.  Non tender, no guarding, rebound, hernias, masses. Musculoskeletal: Full ROM, 5/5 strength, Normal gait Skin: Warm, dry without rashes, lesions, ecchymosis.  Neuro: Awake and oriented X 3, Cranial nerves intact. No cerebellar symptoms.  Psych:  normal affect, Insight and Judgment appropriate.    Terri Piedraourtney Forcucci, PA-C 3:35 PM [facility]

## 2016-06-04 NOTE — Progress Notes (Deleted)
Assessment and Plan:  Hypertension:  -Continue medication -monitor blood pressure at home. -Continue DASH diet -Reminder to go to the ER if any CP, SOB, nausea, dizziness, severe HA, changes vision/speech, left arm numbness and tingling and jaw pain.  Cholesterol - Continue diet and exercise -Check cholesterol.   Diabetes {with or without complications:30421263} -Continue diet and exercise.  -Check A1C  Vitamin D Def -check level -continue medications.   Continue diet and meds as discussed. Further disposition pending results of labs. Discussed med's effects and SE's.    HPI 65 y.o. female  presents for 3 month follow up with hypertension, hyperlipidemia, diabetes and vitamin D deficiency.   Her blood pressure {HAS HAS NOT:18834} been controlled at home, today their BP is  .She {DOES_DOES ZOX:09604}OT:18564} workout. She denies chest pain, shortness of breath, dizziness.   She {ACTION; IS/IS [id number]}OT:21021397} on cholesterol medication and denies myalgias. Her cholesterol {ACTION; IS/IS NOT:21021397} at goal. The cholesterol was:  01/17/2016: Cholesterol 240*; HDL 44*; LDL Cholesterol 157*; Triglycerides 193*   She {Has/has not:18111} been working on diet and exercise for diabetes {with or without complications:30421263}, she {ACTION; IS/IS NOT:21021397} on bASA, she {ACTION; IS/IS WGN:[id number]}OT:21021397} on ACE/ARB, and denies  {Symptoms; diabetes w/o none:19199}. Last A1C was: 01/17/2016: Hgb A1c MFr Bld 7.5*   Patient is on Vitamin D supplement. 01/17/2016: Vit D, 25-Hydroxy 69    Current Medications:  Current Outpatient Prescriptions on File Prior to Visit  Medication Sig Dispense Refill  . ALPRAZolam (XANAX) 0.5 MG tablet TAKE 1/2 TO 1 TABLET BY MOUTH 3 TIMES DAILY AS NEEDED 90 tablet 3  . aspirin EC 81 MG tablet Take 81 mg by mouth daily.    Marland Kitchen. atenolol (TENORMIN) 50 MG tablet TAKE 1 TABLET BY MOUTH EVERY DAY FOR BP & PULSE RATE 90 tablet 0  . buPROPion (WELLBUTRIN XL) 150 MG 24 hr tablet Take 1  tablet (150 mg total) by mouth every morning. 90 tablet 0  . Cholecalciferol (VITAMIN D PO) Take 2,000 Units by mouth 3 (three) times daily.    . citalopram (CELEXA) 40 MG tablet TAKE 1 TABLET EVERY DAY 90 tablet 1  . losartan-hydrochlorothiazide (HYZAAR) 100-25 MG tablet Take 1 tablet by mouth daily. 30 tablet 11  . Magnesium 250 MG TABS Take 250 mg by mouth daily.    . metFORMIN (GLUCOPHAGE-XR) 500 MG 24 hr tablet TAKE 1 TO 2 TABLETS BY MOUTH TWICE DAILY WITH FOOD 360 tablet 1  . Multiple Vitamins-Minerals (MULTIVITAMIN PO) Take by mouth daily.    . rosuvastatin (CRESTOR) 20 MG tablet TAKE 1 TABLET (20 MG TOTAL) BY MOUTH DAILY. 90 tablet 1   No current facility-administered medications on file prior to visit.   Medical History:  Past Medical History  Diagnosis Date  . Hypertension   . Hyperlipidemia   . Fibromyalgia   . Back pain   . Diabetes mellitus without complication (HCC)   . Type II or unspecified type diabetes mellitus without mention of complication, not stated as uncontrolled   . History of kidney stones   . Vitamin D deficiency    Allergies:  Allergies  Allergen Reactions  . Other Other (See Comments)    Unknown anesthetic; unknown reaction; Anesthetic was given during back surgery     Review of Systems:  ROS  Family history- Review and unchanged  Social history- Review and unchanged  Physical Exam: Temp(Src) 98 F (36.7 C) (Temporal)  Resp 16  Ht 5\' 3"  (1.6 m)  Wt 182 lb (82.555 kg)  BMI 32.25 kg/m2 Wt Readings from Last 3 Encounters:  06/04/16 182 lb (82.555 kg)  04/17/16 182 lb (82.555 kg)  01/17/16 185 lb (83.915 kg)   General Appearance: Well nourished well developed, non-toxic appearing, in no apparent distress. Eyes: PERRLA, EOMs, conjunctiva no swelling or erythema ENT/Mouth: Ear canals clear with no erythema, swelling, or discharge.  TMs normal bilaterally, oropharynx clear, moist, with no exudate.   Neck: Supple, thyroid normal, no JVD, no  cervical adenopathy.  Respiratory: Respiratory effort normal, breath sounds clear A&P, no wheeze, rhonchi or rales noted.  No retractions, no accessory muscle usage Cardio: RRR with no MRGs. No noted edema.  Abdomen: Soft, + BS.  Non tender, no guarding, rebound, hernias, masses. Musculoskeletal: Full ROM, 5/5 strength, {PSY - GAIT AND STATION:22860} gait Skin: Warm, dry without rashes, lesions, ecchymosis.  Neuro: Awake and oriented X 3, Cranial nerves intact. No cerebellar symptoms.  Psych: normal affect, Insight and Judgment appropriate.    Terri Piedraourtney Forcucci, PA-C 3:25 PM [facility]

## 2016-07-08 ENCOUNTER — Encounter: Payer: Self-pay | Admitting: Physician Assistant

## 2016-07-11 ENCOUNTER — Other Ambulatory Visit: Payer: Self-pay | Admitting: Internal Medicine

## 2016-07-16 ENCOUNTER — Encounter: Payer: Self-pay | Admitting: Physician Assistant

## 2016-07-17 ENCOUNTER — Other Ambulatory Visit: Payer: Self-pay | Admitting: Internal Medicine

## 2016-07-22 ENCOUNTER — Encounter: Payer: Self-pay | Admitting: Physician Assistant

## 2016-08-21 ENCOUNTER — Encounter: Payer: Self-pay | Admitting: Physician Assistant

## 2016-08-21 ENCOUNTER — Ambulatory Visit (INDEPENDENT_AMBULATORY_CARE_PROVIDER_SITE_OTHER): Payer: Medicare Other | Admitting: Physician Assistant

## 2016-08-21 VITALS — BP 122/60 | HR 73 | Temp 97.6°F | Resp 16 | Ht 62.0 in | Wt 177.6 lb

## 2016-08-21 DIAGNOSIS — Z0000 Encounter for general adult medical examination without abnormal findings: Secondary | ICD-10-CM

## 2016-08-21 DIAGNOSIS — Z0001 Encounter for general adult medical examination with abnormal findings: Secondary | ICD-10-CM

## 2016-08-21 DIAGNOSIS — D649 Anemia, unspecified: Secondary | ICD-10-CM

## 2016-08-21 DIAGNOSIS — E113599 Type 2 diabetes mellitus with proliferative diabetic retinopathy without macular edema, unspecified eye: Secondary | ICD-10-CM

## 2016-08-21 DIAGNOSIS — Z79899 Other long term (current) drug therapy: Secondary | ICD-10-CM

## 2016-08-21 DIAGNOSIS — Z87442 Personal history of urinary calculi: Secondary | ICD-10-CM

## 2016-08-21 DIAGNOSIS — E785 Hyperlipidemia, unspecified: Secondary | ICD-10-CM

## 2016-08-21 DIAGNOSIS — I10 Essential (primary) hypertension: Secondary | ICD-10-CM

## 2016-08-21 DIAGNOSIS — Z136 Encounter for screening for cardiovascular disorders: Secondary | ICD-10-CM

## 2016-08-21 DIAGNOSIS — Z6832 Body mass index (BMI) 32.0-32.9, adult: Secondary | ICD-10-CM

## 2016-08-21 DIAGNOSIS — E559 Vitamin D deficiency, unspecified: Secondary | ICD-10-CM

## 2016-08-21 DIAGNOSIS — E2839 Other primary ovarian failure: Secondary | ICD-10-CM

## 2016-08-21 DIAGNOSIS — E1122 Type 2 diabetes mellitus with diabetic chronic kidney disease: Secondary | ICD-10-CM

## 2016-08-21 DIAGNOSIS — E669 Obesity, unspecified: Secondary | ICD-10-CM

## 2016-08-21 DIAGNOSIS — F3341 Major depressive disorder, recurrent, in partial remission: Secondary | ICD-10-CM

## 2016-08-21 DIAGNOSIS — N182 Chronic kidney disease, stage 2 (mild): Secondary | ICD-10-CM

## 2016-08-21 LAB — BASIC METABOLIC PANEL WITH GFR
BUN: 32 mg/dL — AB (ref 7–25)
CALCIUM: 10.9 mg/dL — AB (ref 8.6–10.4)
CO2: 27 mmol/L (ref 20–31)
CREATININE: 1.55 mg/dL — AB (ref 0.50–0.99)
Chloride: 103 mmol/L (ref 98–110)
GFR, Est African American: 40 mL/min — ABNORMAL LOW (ref >=60)
GFR, Est Non African American: 35 mL/min — ABNORMAL LOW (ref >=60)
GLUCOSE: 134 mg/dL — AB (ref 65–99)
Potassium: 4.3 mmol/L (ref 3.5–5.3)
SODIUM: 142 mmol/L (ref 135–146)

## 2016-08-21 LAB — CBC WITH DIFFERENTIAL/PLATELET
BASOS ABS: 0 {cells}/uL (ref 0–200)
Basophils Relative: 0 %
EOS PCT: 3 %
Eosinophils Absolute: 219 cells/uL (ref 15–500)
HCT: 34.7 % — ABNORMAL LOW (ref 35.0–45.0)
HEMOGLOBIN: 10.7 g/dL — AB (ref 11.7–15.5)
LYMPHS ABS: 1460 {cells}/uL (ref 850–3900)
Lymphocytes Relative: 20 %
MCH: 25.4 pg — AB (ref 27.0–33.0)
MCHC: 30.8 g/dL — AB (ref 32.0–36.0)
MCV: 82.4 fL (ref 80.0–100.0)
MPV: 10.7 fL (ref 7.5–12.5)
Monocytes Absolute: 657 cells/uL (ref 200–950)
Monocytes Relative: 9 %
NEUTROS PCT: 68 %
Neutro Abs: 4964 cells/uL (ref 1500–7800)
Platelets: 289 10*3/uL (ref 140–400)
RBC: 4.21 MIL/uL (ref 3.80–5.10)
RDW: 16.1 % — ABNORMAL HIGH (ref 11.0–15.0)
WBC: 7.3 10*3/uL (ref 3.8–10.8)

## 2016-08-21 LAB — HEPATIC FUNCTION PANEL
ALT: 15 U/L (ref 6–29)
AST: 20 U/L (ref 10–35)
Albumin: 4.5 g/dL (ref 3.6–5.1)
Alkaline Phosphatase: 52 U/L (ref 33–130)
BILIRUBIN DIRECT: 0.1 mg/dL (ref <=0.2)
BILIRUBIN TOTAL: 0.6 mg/dL (ref 0.2–1.2)
Indirect Bilirubin: 0.5 mg/dL (ref 0.2–1.2)
Total Protein: 6.9 g/dL (ref 6.1–8.1)

## 2016-08-21 LAB — IRON AND TIBC
%SAT: 13 % (ref 11–50)
IRON: 54 ug/dL (ref 45–160)
TIBC: 411 ug/dL (ref 250–450)
UIBC: 357 ug/dL (ref 125–400)

## 2016-08-21 LAB — LIPID PANEL
CHOL/HDL RATIO: 2.3 ratio (ref <=5.0)
Cholesterol: 96 mg/dL — ABNORMAL LOW (ref 125–200)
HDL: 41 mg/dL — AB (ref >=46)
LDL Cholesterol: 29 mg/dL (ref <130)
TRIGLYCERIDES: 132 mg/dL (ref <150)
VLDL: 26 mg/dL (ref <30)

## 2016-08-21 LAB — MAGNESIUM: Magnesium: 1.8 mg/dL (ref 1.5–2.5)

## 2016-08-21 LAB — TSH: TSH: 1.57 m[IU]/L

## 2016-08-21 MED ORDER — CITALOPRAM HYDROBROMIDE 40 MG PO TABS: 40.0000 mg | ORAL_TABLET | Freq: Every day | ORAL | 1 refills | Status: DC

## 2016-08-21 NOTE — Progress Notes (Signed)
Complete Physical  Assessment and Plan: Essential hypertension - continue medications, DASH diet, exercise and monitor at home. Call if greater than 130/80.  - CBC with Differential/Platelet - BASIC METABOLIC PANEL WITH GFR - Hepatic function panel - TSH - Urinalysis, Routine w reflex microscopic ([facility]) - Microalbumin / creatinine urine ratio  CKD stage 2 due to type 2 diabetes mellitus Discussed general issues about diabetes pathophysiology and management., Educational material distributed., Suggested low cholesterol diet., Encouraged aerobic exercise., Discussed foot care., Reminded to get yearly retinal exam. - BASIC METABOLIC PANEL WITH GFR - Hemoglobin A1c - Insulin, fasting - Urinalysis, Routine w reflex microscopic (not [facility]) - Microalbumin / creatinine urine ratio - HM DIABETES FOOT EXAM  Hyperlipidemia -continue medications, check lipids, decrease fatty foods, increase activity.  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 Ashley RoyaltyMatthews, decrease sugars  Depression remission States controlled on meds, declines changes  History of kidney stones Increase fluids  Estrogen deficiency - DG Bone Density; Future  Discussed med's effects and SE's. Screening labs and tests as requested with regular follow-up as recommended. Over 40 minutes of exam, counseling, chart review, and complex, high level critical decision making was performed this visit.  Future Appointments Date Time Provider Department Center  08/27/2017 10:00 AM Quentin MullingAmanda Ahriana Gunkel, PA-C [facility] None    HPI  65 y.o. female  presents for a complete physical.  Her blood pressure has been controlled at home,  today their BP is BP: 122/60  She does not workout. She denies chest pain, shortness of breath, dizziness.  She is on cholesterol medication and denies myalgias. Her cholesterol is at goal. The cholesterol last visit was:   Lab Results  Component Value Date   CHOL 105 (L) 06/04/2016   HDL 47 06/04/2016   LDLCALC 33 06/04/2016   TRIG 126 06/04/2016   CHOLHDL 2.2 06/04/2016   She has been working on diet and exercise for diabetes with CKD stage II and retinopathy, she is on bASA, she is on ACE/ARB and denies paresthesia of the feet, polydipsia, polyuria and visual disturbances. Last A1C in the office was:  Lab Results  Component Value Date   HGBA1C 6.9 (H) 06/04/2016   Patient is on Vitamin D supplement.   Lab Results  Component Value Date   VD25OH 69 01/17/2016     She is on celexa/wellbutrin/xanax for depression, she is off wellbutrin. No SI/HI. Her son passed Jan 2nd Due to drugs.   BMI is Body mass index is 32.48 kg/m., she is working on diet and exercise. Wt Readings from Last 3 Encounters:  08/21/16 177 lb 9.6 oz (80.6 kg)  06/04/16 182 lb (82.6 kg)  04/17/16 182 lb (82.6 kg)    Current Medications:  Current Outpatient Prescriptions on File Prior to Visit  Medication Sig Dispense Refill  . ALPRAZolam (XANAX) 0.5 MG tablet TAKE 1/2 TO 1 TABLET BY MOUTH 3 TIMES DAILY AS NEEDED 90 tablet 3  . aspirin EC 81 MG tablet Take 81 mg by mouth daily.    Marland Kitchen. atenolol (TENORMIN) 50 MG tablet TAKE 1 TABLET BY MOUTH EVERY DAY FOR BP & PULSE RATE 90 tablet 0  . Cholecalciferol (VITAMIN D PO) Take 2,000 Units by mouth 3 (three)  times daily.    . citalopram (CELEXA) 40 MG tablet TAKE 1 TABLET EVERY DAY 90 tablet 1  . losartan-hydrochlorothiazide (HYZAAR) 100-25 MG tablet Take 1 tablet by mouth daily. 30 tablet 11  . metFORMIN (GLUCOPHAGE-XR) 500 MG 24 hr tablet TAKE 1 TO 2 TABLETS BY MOUTH TWICE DAILY WITH FOOD 360 tablet 1  . Multiple Vitamins-Minerals (MULTIVITAMIN PO) Take by mouth daily.     . rosuvastatin (CRESTOR) 20 MG tablet TAKE 1 TABLET BY MOUTH EVERY DAY 90 tablet 1   No current facility-administered medications on file prior to visit.    Health Maintenance:   Immunization History  Administered Date(s) Administered  . Influenza Split 08/17/2013, 09/04/2014  . Influenza, High Dose Seasonal PF 08/28/2015  . Pneumococcal Conjugate-13 05/29/2014  . Pneumococcal Polysaccharide-23 04/17/2016  . Td 05/06/2006  . Tdap 09/04/2014   Last colonoscopy: 07/19/2015 at [facility], repeat 5 years Last mammogram: 01/2014, over due will get Last pap smear/pelvic exam: 2013 negative no more after TAH  DEXA: 2014 good, need repeat with history of fracture, declines CT head 2012 CT cervical 2010 MRI lumbar 2009 [place] AB 2012 CXR 2014  Prior vaccinations: TD or Tdap: 2015 Influenza: 2016 TODAY Pneumococcal: 2008 Prevnar 13: 2015 Shingles/Zostavax: 2012  Last Dental Exam: Dr. Shea Evans Last Eye Exam: Dr. Vonna Kotyk May 2016 [facility]- May 2015 Dr. Ashley Royalty- retinopathy May 2016 Patient Care Team: Lucky Cowboy, MD as PCP - General (Internal Medicine) Caryl Never, MD (Dentistry) Mia Creek, MD as Consulting Physician (Ophthalmology) Sharrell Ku, MD as Consulting Physician (Gastroenterology) Foster Simpson, MD as Consulting Physician (Obstetrics and Gynecology) Paulina Fusi, MD as Referring Physician (Orthopedic Surgery) Doristine Section, MD as Consulting Physician (Orthopedic Surgery) Luetta Nutting, MD as Referring Physician (Orthopedic Surgery)  Allergies:  Allergies  Allergen Reactions  . Other Other (See Comments)    Unknown anesthetic; unknown reaction; Anesthetic was given during back surgery   Medical History:  Past Medical History:  Diagnosis Date  . Back pain   . Diabetes mellitus without complication (HCC)   . Fibromyalgia   . History of kidney stones   . Hyperlipidemia   . Hypertension   . Type II or unspecified type diabetes mellitus  without mention of complication, not stated as uncontrolled   . Vitamin D deficiency    Surgical History:  Past Surgical History:  Procedure Laterality Date  . INTRAMEDULLARY (IM) NAIL INTERTROCHANTERIC Left 09/29/2013   Procedure: INTRAMEDULLARY (IM) NAIL INTERTROCHANTRIC;  Surgeon: Mable Paris, MD;  Location: [facility] OR;  Service: Orthopedics;  Laterality: Left;   Family History:  Family History  Problem Relation Age of Onset  . Heart disease Mother   . Lupus Mother   . Kidney disease Mother   . Heart attack Father   . Heart disease Father   . Hypertension Father   . Cancer Sister   . Lupus Sister    Social History:  Social History  Substance Use Topics  . Smoking status: Never Smoker  . Smokeless tobacco: Never Used  . Alcohol use No   Review of Systems: Review of Systems  Constitutional: Negative for chills, diaphoresis, fever, malaise/fatigue and weight loss.  HENT: Negative for congestion, ear discharge, ear pain, hearing loss, nosebleeds, sore throat and tinnitus.        + snoring  Eyes: Negative.   Respiratory: Negative for cough, hemoptysis, sputum production, shortness of breath, wheezing and stridor.   Cardiovascular: Negative.   Gastrointestinal: Negative for abdominal pain, blood in stool (no mucus),  constipation, diarrhea, heartburn, melena, nausea and vomiting.  Genitourinary: Negative.        + stress incontinence  Musculoskeletal: Negative.   Skin: Negative.   Neurological: Negative.  Negative for weakness and headaches.  Psychiatric/Behavioral: Negative for depression, hallucinations, memory loss, substance abuse and suicidal ideas. The patient is not nervous/anxious and does not have insomnia.     Physical Exam: Estimated body mass index is 32.48 kg/m as calculated from the following:   Height as of this encounter: 5\' 2"  (1.575 m).   Weight as of this encounter: 177 lb 9.6 oz (80.6 kg). BP 122/60   Pulse 73   Temp 97.6 F (36.4 C)   Resp  16   Ht 5\' 2"  (1.575 m)   Wt 177 lb 9.6 oz (80.6 kg)   SpO2 98%   BMI 32.48 kg/m  General Appearance: Well nourished, in no apparent distress.  Eyes: PERRLA, EOMs, conjunctiva no swelling or erythema, normal fundi and vessels.  Sinuses: No Frontal/maxillary tenderness  ENT/Mouth: Ext aud canals clear, normal light reflex with TMs without erythema, bulging. Good dentition. No erythema, swelling, or exudate on post pharynx. Tonsils not swollen or erythematous. Hearing normal.  Neck: Supple, thyroid normal. No bruits  Respiratory: Respiratory effort normal, BS equal bilaterally without rales, rhonchi, wheezing or stridor.  Cardio: RRR without murmurs, rubs or gallops. Brisk peripheral pulses without edema.  Chest: symmetric, with normal excursions and percussion.  Breasts: Symmetric, without lumps, nipple discharge, retractions.  Abdomen: Soft, nontender, no guarding, rebound, hernias, masses, or organomegaly.  Lymphatics: Non tender without lymphadenopathy.  Genitourinary: defer Musculoskeletal: Full ROM all peripheral extremities,5/5 strength, and gait antalgic  Skin: Warm, dry without rashes, lesions, ecchymosis. Neuro: Cranial nerves intact, reflexes equal bilaterally. Normal muscle tone, no cerebellar symptoms. Sensation intact.  Psych: Awake and oriented X 3, normal affect, Insight and Judgment appropriate.    EKG: unchanged ST changes anterior leads AORTA SCAN: defer   Quentin Mulling 2:33 PM [facility]

## 2016-08-21 NOTE — Patient Instructions (Addendum)
[facility]  7 a.m.-6:30 p.m., Monday 7 a.m.-5 p.m., Tuesday-Friday Schedule an appointment by calling [phone].   Encourage you to get the 3D Mammogram  The 3D Mammogram is much more specific and sensitive to pick up breast cancer. For women with fibrocystic breast or lumpy breast it can be hard to determine if it is cancer or not but the 3D mammogram is able to tell this difference which cuts back on unneeded additional tests or scary call backs.   - over 40% increase in detection of breast cancer - over 40% reduction in false positives.  - fewer call backs - reduced anxiety - improved outcomes - [facility]   

## 2016-08-22 ENCOUNTER — Other Ambulatory Visit: Payer: Self-pay | Admitting: Physician Assistant

## 2016-08-22 DIAGNOSIS — Z1231 Encounter for screening mammogram for malignant neoplasm of breast: Secondary | ICD-10-CM

## 2016-08-22 LAB — URINALYSIS, MICROSCOPIC ONLY
CRYSTALS: NONE SEEN [HPF]
YEAST: NONE SEEN [HPF]

## 2016-08-22 LAB — VITAMIN D 25 HYDROXY (VIT D DEFICIENCY, FRACTURES): VIT D 25 HYDROXY: 80 ng/mL (ref 30–100)

## 2016-08-22 LAB — URINALYSIS, ROUTINE W REFLEX MICROSCOPIC
Bilirubin Urine: NEGATIVE
Glucose, UA: NEGATIVE
Hgb urine dipstick: NEGATIVE
NITRITE: NEGATIVE
PH: 5 (ref 5.0–8.0)
SPECIFIC GRAVITY, URINE: 1.021 (ref 1.001–1.035)

## 2016-08-22 LAB — VITAMIN B12: Vitamin B-12: 269 pg/mL (ref 200–1100)

## 2016-08-22 LAB — MICROALBUMIN / CREATININE URINE RATIO
Creatinine, Urine: 229 mg/dL (ref 20–320)
MICROALB UR: 3.8 mg/dL
MICROALB/CREAT RATIO: 17 ug/mg{creat} (ref <30)

## 2016-08-22 LAB — HEMOGLOBIN A1C
HEMOGLOBIN A1C: 6.4 % — AB (ref <5.7)
Mean Plasma Glucose: 137 mg/dL

## 2016-08-22 NOTE — Addendum Note (Signed)
Addended by: Doree AlbeeOLLIER, Liliann File R on: 08/22/2016 08:33 AM   Modules accepted: Orders

## 2016-09-02 ENCOUNTER — Ambulatory Visit: Payer: Medicare Other

## 2016-09-02 ENCOUNTER — Other Ambulatory Visit: Payer: Medicare Other

## 2016-09-11 ENCOUNTER — Encounter: Payer: Self-pay | Admitting: Physician Assistant

## 2016-09-18 ENCOUNTER — Ambulatory Visit
Admission: RE | Admit: 2016-09-18 | Discharge: 2016-09-18 | Disposition: A | Discharge status: Home / Self Care | Payer: Medicare Other | Source: Ambulatory Visit | Attending: Physician Assistant | Admitting: Physician Assistant

## 2016-09-18 DIAGNOSIS — E2839 Other primary ovarian failure: Secondary | ICD-10-CM

## 2016-09-18 DIAGNOSIS — Z1231 Encounter for screening mammogram for malignant neoplasm of breast: Secondary | ICD-10-CM

## 2016-09-22 ENCOUNTER — Other Ambulatory Visit: Payer: Self-pay | Admitting: Internal Medicine

## 2016-09-22 ENCOUNTER — Other Ambulatory Visit: Payer: Medicare Other

## 2016-09-22 DIAGNOSIS — E1122 Type 2 diabetes mellitus with diabetic chronic kidney disease: Secondary | ICD-10-CM

## 2016-09-22 DIAGNOSIS — N182 Chronic kidney disease, stage 2 (mild): Principal | ICD-10-CM

## 2016-09-23 ENCOUNTER — Other Ambulatory Visit: Payer: Self-pay | Admitting: Physician Assistant

## 2016-09-23 DIAGNOSIS — E1122 Type 2 diabetes mellitus with diabetic chronic kidney disease: Secondary | ICD-10-CM

## 2016-09-23 DIAGNOSIS — N182 Chronic kidney disease, stage 2 (mild): Principal | ICD-10-CM

## 2016-09-23 DIAGNOSIS — Z87442 Personal history of urinary calculi: Secondary | ICD-10-CM

## 2016-09-23 LAB — BASIC METABOLIC PANEL WITH GFR
BUN: 21 mg/dL (ref 7–25)
CALCIUM: 10.8 mg/dL — AB (ref 8.6–10.4)
CO2: 27 mmol/L (ref 20–31)
CREATININE: 1.39 mg/dL — AB (ref 0.50–0.99)
Chloride: 102 mmol/L (ref 98–110)
GFR, EST AFRICAN AMERICAN: 46 mL/min — AB (ref >=60)
GFR, Est Non African American: 40 mL/min — ABNORMAL LOW (ref >=60)
Glucose, Bld: 123 mg/dL — ABNORMAL HIGH (ref 65–99)
POTASSIUM: 4.5 mmol/L (ref 3.5–5.3)
Sodium: 141 mmol/L (ref 135–146)

## 2016-09-23 MED ORDER — LOSARTAN POTASSIUM 100 MG PO TABS
100.0000 mg | ORAL_TABLET | Freq: Every day | ORAL | 11 refills | Status: DC
Start: 2016-09-23 — End: 2016-12-25

## 2016-09-23 NOTE — Telephone Encounter (Signed)
Rx called into [facility] pharmacy. 

## 2016-10-27 ENCOUNTER — Other Ambulatory Visit: Payer: Self-pay | Admitting: Physician Assistant

## 2016-10-27 NOTE — Telephone Encounter (Signed)
Please call alprazolam 

## 2016-10-28 NOTE — Telephone Encounter (Signed)
Xanax called into [facility].  

## 2016-10-30 ENCOUNTER — Other Ambulatory Visit: Payer: Self-pay | Admitting: Nephrology

## 2016-10-30 DIAGNOSIS — N183 Chronic kidney disease, stage 3 unspecified: Secondary | ICD-10-CM

## 2016-11-20 ENCOUNTER — Ambulatory Visit (INDEPENDENT_AMBULATORY_CARE_PROVIDER_SITE_OTHER): Payer: Medicare Other | Admitting: Physician Assistant

## 2016-11-20 ENCOUNTER — Encounter: Payer: Self-pay | Admitting: Physician Assistant

## 2016-11-20 VITALS — BP 130/88 | HR 63 | Temp 97.6°F | Resp 16 | Ht 63.0 in | Wt 175.6 lb

## 2016-11-20 DIAGNOSIS — I10 Essential (primary) hypertension: Secondary | ICD-10-CM

## 2016-11-20 DIAGNOSIS — R6889 Other general symptoms and signs: Secondary | ICD-10-CM

## 2016-11-20 DIAGNOSIS — E113599 Type 2 diabetes mellitus with proliferative diabetic retinopathy without macular edema, unspecified eye: Secondary | ICD-10-CM

## 2016-11-20 DIAGNOSIS — E6609 Other obesity due to excess calories: Secondary | ICD-10-CM

## 2016-11-20 DIAGNOSIS — N182 Chronic kidney disease, stage 2 (mild): Secondary | ICD-10-CM | POA: Diagnosis not present

## 2016-11-20 DIAGNOSIS — Z6832 Body mass index (BMI) 32.0-32.9, adult: Secondary | ICD-10-CM | POA: Diagnosis not present

## 2016-11-20 DIAGNOSIS — E1122 Type 2 diabetes mellitus with diabetic chronic kidney disease: Secondary | ICD-10-CM

## 2016-11-20 DIAGNOSIS — F3341 Major depressive disorder, recurrent, in partial remission: Secondary | ICD-10-CM

## 2016-11-20 DIAGNOSIS — Z0000 Encounter for general adult medical examination without abnormal findings: Secondary | ICD-10-CM

## 2016-11-20 DIAGNOSIS — M810 Age-related osteoporosis without current pathological fracture: Secondary | ICD-10-CM

## 2016-11-20 DIAGNOSIS — Z0001 Encounter for general adult medical examination with abnormal findings: Secondary | ICD-10-CM | POA: Diagnosis not present

## 2016-11-20 DIAGNOSIS — E785 Hyperlipidemia, unspecified: Secondary | ICD-10-CM | POA: Diagnosis not present

## 2016-11-20 DIAGNOSIS — E559 Vitamin D deficiency, unspecified: Secondary | ICD-10-CM | POA: Diagnosis not present

## 2016-11-20 DIAGNOSIS — Z87442 Personal history of urinary calculi: Secondary | ICD-10-CM

## 2016-11-20 DIAGNOSIS — Z79899 Other long term (current) drug therapy: Secondary | ICD-10-CM

## 2016-11-20 LAB — CBC WITH DIFFERENTIAL/PLATELET
Basophils Absolute: 57 {cells}/uL (ref 0–200)
Basophils Relative: 1 %
Eosinophils Absolute: 114 {cells}/uL (ref 15–500)
Eosinophils Relative: 2 %
HCT: 35.4 % (ref 35.0–45.0)
Hemoglobin: 10.5 g/dL — ABNORMAL LOW (ref 11.7–15.5)
Lymphocytes Relative: 31 %
Lymphs Abs: 1767 {cells}/uL (ref 850–3900)
MCH: 25 pg — ABNORMAL LOW (ref 27.0–33.0)
MCHC: 29.7 g/dL — ABNORMAL LOW (ref 32.0–36.0)
MCV: 84.3 fL (ref 80.0–100.0)
MPV: 9.9 fL (ref 7.5–12.5)
Monocytes Absolute: 456 {cells}/uL (ref 200–950)
Monocytes Relative: 8 %
Neutro Abs: 3306 {cells}/uL (ref 1500–7800)
Neutrophils Relative %: 58 %
Platelets: 334 K/uL (ref 140–400)
RBC: 4.2 MIL/uL (ref 3.80–5.10)
RDW: 15.5 % — ABNORMAL HIGH (ref 11.0–15.0)
WBC: 5.7 K/uL (ref 3.8–10.8)

## 2016-11-20 LAB — TSH: TSH: 1.49 m[IU]/L

## 2016-11-20 LAB — HEMOGLOBIN A1C
HEMOGLOBIN A1C: 6.2 % — AB (ref <5.7)
Mean Plasma Glucose: 131 mg/dL

## 2016-11-20 NOTE — Patient Instructions (Signed)
Needs Eye exam     Bad carbs also include fruit juice, alcohol, and sweet tea. These are empty calories that do not signal to your brain that you are full.   Please remember the good carbs are still carbs which convert into sugar. So please measure them out no more than 1/2-1 cup of rice, oatmeal, pasta, and beans  Veggies are however free foods! Pile them on.   Not all fruit is created equal. Please see the list below, the fruit at the bottom is higher in sugars than the fruit at the top. Please avoid all dried fruits.

## 2016-11-20 NOTE — Progress Notes (Signed)
MEDICARE ANNUAL WELLNESS VISIT AND FOLLOW UP  Assessment:     Essential hypertension - continue medications, DASH diet, exercise and monitor at home. Call if greater than 130/80.  -cont other meds -diet and exercise -DASH diet   Controlled type 2 diabetes mellitus with stage 3 chronic kidney disease, without long-term current use of insulin (HCC) Discussed general issues about diabetes pathophysiology and management., Educational material distributed., Suggested low cholesterol diet., Encouraged aerobic exercise., Discussed foot care., Reminded to get yearly retinal exam. -diet and exerise -cont meds - Hemoglobin A1c  CKD stage 3 due to type 2 diabetes mellitus (HCC) Discussed general issues about diabetes pathophysiology and management., Educational material distributed., Suggested low cholesterol diet., Encouraged aerobic exercise., Discussed foot care., Reminded to get yearly retinal exam. -cont meds -diet and exercise -avoid NSAIDs   Depression, remission, partial -Depression not controlled and complicated with grief over sons recent passing -cont alprazolam -cont celexa -add in wellbutrin -encouraged to use hospice for counseling services   Hyperlipidemia -continue medications, check lipids, decrease fatty foods, increase activity.  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 -encouraged to see eye doctor for yearly exam   Obesity -diet and exercise  Encounter  for Medicare annual wellness exam -due next year   BMI 32.0-32.9,adult -diet and exercise   Over 30 minutes of exam, counseling, chart review, and critical decision making was performed  Plan:   During the course of the visit the patient was educated and counseled about appropriate screening and preventive services including:    Pneumococcal vaccine   Influenza vaccine  Td vaccine  Prevnar 13  Screening electrocardiogram  Screening mammography  Bone densitometry screening  Colorectal cancer screening  Diabetes screening  Glaucoma screening  Nutrition counseling   Advanced directives: given info/requested copies   Subjective:   [name] is a 65 y.o. female who presents for Medicare Annual Wellness Visit and 3 month follow up on hypertension, diabetes with complications, hyperlipidemia, vitamin D def.   Her blood pressure has been controlled at home, today their BP is BP: 130/88 She does not workout. She denies chest pain, shortness of breath, dizziness.  She has depression, on celexa, son died last year, going to have to put down her dog with cancer, rat terrier.  She is on cholesterol medication and denies myalgias. Her cholesterol is not at goal. The cholesterol last visit was:   Lab Results  Component Value Date   CHOL 96 (L) 08/21/2016   HDL 41 (L) 08/21/2016   LDLCALC 29 08/21/2016   TRIG 132 08/21/2016   CHOLHDL 2.3 08/21/2016   She has been working on diet and exercise for Diabetes with diabetic chronic kidney disease, with diabetic polyneuropathy and with diabetic retinopathy; mild non-proliferative; without macular edema, she is on bASA, she is on ACE/ARB, and denies increased appetite, nausea, paresthesia of the feet and polydipsia. Last A1C was:  Lab Results  Component Value Date   HGBA1C 6.4 (H) 08/21/2016    She is following with Dr. Eliott Nineunham for her CKD stage 3.  Last GFR Lab Results  Component Value Date  GFRNONAA 40 (L) 09/22/2016    Patient is on Vitamin D supplement. Lab Results  Component Value Date   VD25OH 80 08/21/2016     BMI is Body mass index is 31.11 kg/m., she is working on diet and exercise. Wt Readings from Last 3 Encounters:  11/20/16 175 lb 9.6 oz (79.7 kg)  08/21/16 177 lb 9.6 oz (80.6 kg)  06/04/16 182 lb (82.6 kg)    Medication Review Current Outpatient Prescriptions on File Prior to Visit  Medication Sig Dispense Refill  . ALPRAZolam (XANAX) 0.5 MG tablet TAKE 1/2 OR 1 TABLET BY MOUTH 3 TIMES A DAY AS NEEDED *GREENSTONE BRAND ONLY* 90 tablet 0  . aspirin EC 81 MG tablet Take 81 mg by mouth daily.    Marland Kitchen atenolol (TENORMIN) 50 MG tablet TAKE 1 TABLET BY MOUTH EVERY DAY FOR BP & PULSE RATE 90 tablet 0  . citalopram (CELEXA) 40 MG tablet Take 1 tablet (40 mg total) by mouth daily. 90 tablet 1  . losartan (COZAAR) 100 MG tablet Take 1 tablet (100 mg total) by mouth daily. 30 tablet 11  . metFORMIN (GLUCOPHAGE-XR) 500 MG 24 hr tablet TAKE 1 TO 2 TABLETS BY MOUTH TWICE DAILY WITH FOOD 360 tablet 1  . Multiple Vitamins-Minerals (MULTIVITAMIN PO) Take by mouth daily.    . rosuvastatin (CRESTOR) 20 MG tablet TAKE 1 TABLET BY MOUTH EVERY DAY 90 tablet 1   No current facility-administered medications on file prior to visit.     Current Problems (verified) Patient Active Problem List   Diagnosis Date Noted  . Osteoporosis 11/20/2016  . BMI 32.0-32.9,adult 10/17/2015  . T2_NIDDM, controlled (HCC) 10/16/2015  . Encounter for Medicare annual wellness exam 07/04/2015  . Diabetic retinopathy (HCC) 04/02/2015  . Obesity 04/02/2015  . Medication management 12/11/2014  . CKD stage 2 due to type 2 diabetes mellitus (HCC)   . History of kidney stones   . Vitamin D deficiency   . 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 Hyperlipidemia 11/16/2013  . Hypertension 09/28/2013    Screening Tests Immunization History  Administered Date(s) Administered  . Influenza Split 08/17/2013,  09/04/2014, 10/20/2016  . Influenza, High Dose Seasonal PF 08/28/2015  . Pneumococcal Conjugate-13 05/29/2014  . Pneumococcal Polysaccharide-23 04/17/2016  . Td 05/06/2006  . Tdap 09/04/2014    Preventative care: Last colonoscopy: 01/2014 Dr. Matthias Hughs Last mammogram: 08/2016 PAP 2010 CT cervical 2010 CT head 2012 DEXA 08/2016 [place] AB 2012  Prior vaccinations: TD or Tdap: 2015  Influenza: 2017 Pneumococcal: 2017 Prevnar13: 2015 Shingles/Zostavax: Done,   Names of Other Physician/Practitioners you currently use: 1. [facility]- here for primary care 2. Dr. Vonna Kotyk, eye doctor, last visit 2015 3. Dr. Shea Evans, dentist, last visit 2016 Patient Care Team: Lucky Cowboy, MD as PCP - General (Internal Medicine) Caryl Never, MD (Dentistry) Mia Creek, MD as Consulting Physician (Ophthalmology) Sharrell Ku, MD as Consulting Physician (Gastroenterology) Foster Simpson, MD as Consulting Physician (Obstetrics and Gynecology) Paulina Fusi, MD as Referring Physician (Orthopedic Surgery) Doristine Section, MD as Consulting Physician (Orthopedic Surgery) Luetta Nutting, MD as Referring Physician (Orthopedic Surgery)  Allergies Allergies  Allergen Reactions  . Other Other (See Comments)    Unknown anesthetic; unknown reaction; Anesthetic was given during back surgery    SURGICAL HISTORY She  has a past surgical history that includes Intramedullary (im) nail intertrochanteric (Left, 09/29/2013). FAMILY HISTORY Her family history includes Cancer in her sister; Heart attack in her father; Heart disease in  her father and mother; Hypertension in her father; Kidney disease in her mother; Lupus in her mother and sister. SOCIAL HISTORY She  reports that she has never smoked. She has never used smokeless tobacco. She reports that she does not drink alcohol or use drugs.  MEDICARE WELLNESS OBJECTIVES: Physical activity: Current Exercise Habits: Home exercise  routine, Type of exercise: walking, Time (Minutes): 20, Frequency (Times/Week): 5, Weekly Exercise (Minutes/Week): 100, Intensity: Mild Cardiac risk factors: Cardiac Risk Factors include: advanced age (>3055men, 21>65 women);diabetes mellitus;dyslipidemia;hypertension;sedentary lifestyle;obesity (BMI >30kg/m2) Depression/mood screen:   Depression screen [facility] 11/20/2016  Decreased Interest 1  Down, Depressed, Hopeless 0  PHQ - 2 Score 1  Altered sleeping -  Tired, decreased energy -  Change in appetite -  Feeling bad or failure about yourself  -  Trouble concentrating -  Moving slowly or fidgety/restless -  Suicidal thoughts -  PHQ-9 Score -  Difficult doing work/chores -    ADLs:  In your present state of health, do you have any difficulty performing the following activities: 11/20/2016 04/17/2016  Hearing? N N  Vision? N N  Difficulty concentrating or making decisions? N N  Walking or climbing stairs? N N  Dressing or bathing? N N  Doing errands, shopping? N N  Preparing Food and eating ? - N  Using the Toilet? - N  In the past six months, have you accidently leaked urine? - N  Do you have problems with loss of bowel control? - N  Managing your Medications? - N  Managing your Finances? - N  Housekeeping or managing your Housekeeping? - N  Some recent data might be hidden     Cognitive Testing  Alert? Yes  Normal Appearance?Yes  Oriented to person? Yes  Place? Yes   Time? Yes  Recall of three objects?  Yes  Can perform simple calculations? Yes  Displays appropriate judgment?Yes  Can read the correct time from a watch face?Yes  EOL planning: Does Patient Have a Medical Advance Directive?: No Would patient like information on creating a medical advance directive?: No - Patient declined   Objective:   Today's Vitals   11/20/16 1431  BP: 130/88  Pulse: 63  Resp: 16  Temp: 97.6 F (36.4 C)  SpO2: 96%  Weight: 175 lb 9.6 oz (79.7 kg)  Height: 5\' 3"  (1.6 m)  PainSc:  5   PainLoc: Back   Body mass index is 31.11 kg/m.  General appearance: alert, no distress, WD/WN,  female HEENT: normocephalic, sclerae anicteric, TMs pearly, nares patent, no discharge or erythema, pharynx normal Oral cavity: MMM, no lesions Neck: supple, no lymphadenopathy, no thyromegaly, no masses Heart: RRR, normal S1, S2, no murmurs Lungs: CTA bilaterally, no wheezes, rhonchi, or rales Abdomen: +bs, soft, non tender, non distended, no masses, no hepatomegaly, no splenomegaly Musculoskeletal: nontender, no swelling, no obvious deformity Extremities: no edema, no cyanosis, no clubbing Pulses: 2+ symmetric, upper and lower extremities, normal cap refill Neurological: alert, oriented x 3, CN2-12 intact, strength normal upper extremities and lower extremities, sensation normal throughout, DTRs 2+ throughout, no cerebellar signs, gait normal Psychiatric: Withdrawn, behavior normal, pleasant  Breast: defer Gyn: defer Rectal: defer   Medicare Attestation I have personally reviewed: The patient's medical and social history Their use of alcohol, tobacco or illicit drugs Their current medications and supplements The patient's functional ability including ADLs,fall risks, home safety risks, cognitive, and hearing and visual impairment Diet and physical activities Evidence for depression or mood disorders  The patient's  weight, height, BMI, and visual acuity have been recorded in the chart.  I have made referrals, counseling, and provided education to the patient based on review of the above and I have provided the patient with a written personalized care plan for preventive services.     Quentin Mulling, PA-C   11/20/2016

## 2016-11-21 LAB — VITAMIN D 25 HYDROXY (VIT D DEFICIENCY, FRACTURES): VIT D 25 HYDROXY: 77 ng/mL (ref 30–100)

## 2016-11-21 LAB — BASIC METABOLIC PANEL WITH GFR
BUN: 19 mg/dL (ref 7–25)
CHLORIDE: 104 mmol/L (ref 98–110)
CO2: 25 mmol/L (ref 20–31)
Calcium: 10.6 mg/dL — ABNORMAL HIGH (ref 8.6–10.4)
Creat: 1.23 mg/dL — ABNORMAL HIGH (ref 0.50–0.99)
GFR, EST AFRICAN AMERICAN: 52 mL/min — AB (ref >=60)
GFR, EST NON AFRICAN AMERICAN: 45 mL/min — AB (ref >=60)
Glucose, Bld: 110 mg/dL — ABNORMAL HIGH (ref 65–99)
POTASSIUM: 4.7 mmol/L (ref 3.5–5.3)
Sodium: 143 mmol/L (ref 135–146)

## 2016-11-21 LAB — LIPID PANEL
CHOL/HDL RATIO: 2.5 ratio (ref <5.0)
Cholesterol: 116 mg/dL (ref <200)
HDL: 46 mg/dL — AB (ref >50)
LDL CALC: 44 mg/dL (ref <100)
TRIGLYCERIDES: 132 mg/dL (ref <150)
VLDL: 26 mg/dL (ref <30)

## 2016-11-21 LAB — HEPATIC FUNCTION PANEL
ALK PHOS: 54 U/L (ref 33–130)
ALT: 14 U/L (ref 6–29)
AST: 20 U/L (ref 10–35)
Albumin: 4.4 g/dL (ref 3.6–5.1)
BILIRUBIN INDIRECT: 0.4 mg/dL (ref 0.2–1.2)
BILIRUBIN TOTAL: 0.5 mg/dL (ref 0.2–1.2)
Bilirubin, Direct: 0.1 mg/dL (ref <=0.2)
Total Protein: 7 g/dL (ref 6.1–8.1)

## 2016-11-21 LAB — MAGNESIUM: Magnesium: 1.7 mg/dL (ref 1.5–2.5)

## 2016-11-25 ENCOUNTER — Other Ambulatory Visit: Payer: Self-pay | Admitting: Physician Assistant

## 2016-11-26 ENCOUNTER — Other Ambulatory Visit: Payer: Medicare Other

## 2016-11-26 ENCOUNTER — Ambulatory Visit
Admission: RE | Admit: 2016-11-26 | Discharge: 2016-11-26 | Disposition: A | Discharge status: Home / Self Care | Payer: Medicare Other | Source: Ambulatory Visit | Attending: Nephrology | Admitting: Nephrology

## 2016-11-26 DIAGNOSIS — N183 Chronic kidney disease, stage 3 unspecified: Secondary | ICD-10-CM

## 2016-11-26 NOTE — Telephone Encounter (Signed)
Please call alprazolam 

## 2016-11-27 ENCOUNTER — Telehealth: Payer: Self-pay

## 2016-11-27 NOTE — Telephone Encounter (Signed)
Insurance form was mailed out to pt on 11/27/2016 @ 10:50am

## 2016-12-22 ENCOUNTER — Other Ambulatory Visit: Payer: Self-pay | Admitting: Internal Medicine

## 2016-12-25 ENCOUNTER — Other Ambulatory Visit: Payer: Self-pay

## 2016-12-25 MED ORDER — LOSARTAN POTASSIUM 100 MG PO TABS: 100.0000 mg | ORAL_TABLET | Freq: Every day | ORAL | 1 refills | Status: AC

## 2016-12-28 ENCOUNTER — Other Ambulatory Visit: Payer: Self-pay | Admitting: Internal Medicine

## 2017-01-15 ENCOUNTER — Other Ambulatory Visit: Payer: Self-pay | Admitting: Internal Medicine

## 2017-01-22 ENCOUNTER — Other Ambulatory Visit: Payer: Self-pay | Admitting: Internal Medicine

## 2017-01-29 ENCOUNTER — Other Ambulatory Visit: Payer: Self-pay | Admitting: Internal Medicine

## 2017-01-30 NOTE — Telephone Encounter (Signed)
Please call Alprazolam 

## 2017-02-15 ENCOUNTER — Other Ambulatory Visit: Payer: Self-pay | Admitting: Physician Assistant

## 2017-02-26 ENCOUNTER — Ambulatory Visit (INDEPENDENT_AMBULATORY_CARE_PROVIDER_SITE_OTHER): Payer: Medicare Other | Admitting: Internal Medicine

## 2017-02-26 VITALS — BP 122/80 | HR 72 | Temp 97.4°F | Resp 16 | Ht 63.0 in | Wt 171.0 lb

## 2017-02-26 DIAGNOSIS — I10 Essential (primary) hypertension: Secondary | ICD-10-CM | POA: Diagnosis not present

## 2017-02-26 DIAGNOSIS — N183 Chronic kidney disease, stage 3 (moderate): Secondary | ICD-10-CM | POA: Diagnosis not present

## 2017-02-26 DIAGNOSIS — E559 Vitamin D deficiency, unspecified: Secondary | ICD-10-CM

## 2017-02-26 DIAGNOSIS — E782 Mixed hyperlipidemia: Secondary | ICD-10-CM | POA: Diagnosis not present

## 2017-02-26 DIAGNOSIS — E1122 Type 2 diabetes mellitus with diabetic chronic kidney disease: Secondary | ICD-10-CM | POA: Diagnosis not present

## 2017-02-26 DIAGNOSIS — Z79899 Other long term (current) drug therapy: Secondary | ICD-10-CM | POA: Diagnosis not present

## 2017-02-26 LAB — HEPATIC FUNCTION PANEL
ALT: 11 U/L (ref 6–29)
AST: 16 U/L (ref 10–35)
Albumin: 4 g/dL (ref 3.6–5.1)
Alkaline Phosphatase: 67 U/L (ref 33–130)
BILIRUBIN DIRECT: 0.1 mg/dL (ref <=0.2)
BILIRUBIN INDIRECT: 0.4 mg/dL (ref 0.2–1.2)
BILIRUBIN TOTAL: 0.5 mg/dL (ref 0.2–1.2)
Total Protein: 6.9 g/dL (ref 6.1–8.1)

## 2017-02-26 LAB — LIPID PANEL
CHOL/HDL RATIO: 2.4 ratio (ref <5.0)
CHOLESTEROL: 111 mg/dL (ref <200)
HDL: 46 mg/dL — AB (ref >50)
LDL Cholesterol: 40 mg/dL (ref <100)
TRIGLYCERIDES: 123 mg/dL (ref <150)
VLDL: 25 mg/dL (ref <30)

## 2017-02-26 LAB — BASIC METABOLIC PANEL WITH GFR
BUN: 19 mg/dL (ref 7–25)
CALCIUM: 10.2 mg/dL (ref 8.6–10.4)
CO2: 23 mmol/L (ref 20–31)
Chloride: 105 mmol/L (ref 98–110)
Creat: 1.09 mg/dL — ABNORMAL HIGH (ref 0.50–0.99)
GFR, Est African American: 61 mL/min (ref >=60)
GFR, Est Non African American: 53 mL/min — ABNORMAL LOW (ref >=60)
Glucose, Bld: 82 mg/dL (ref 65–99)
Potassium: 4.5 mmol/L (ref 3.5–5.3)
Sodium: 142 mmol/L (ref 135–146)

## 2017-02-26 LAB — CBC WITH DIFFERENTIAL/PLATELET
BASOS PCT: 0 %
Basophils Absolute: 0 cells/uL (ref 0–200)
EOS PCT: 3 %
Eosinophils Absolute: 201 cells/uL (ref 15–500)
HCT: 35.7 % (ref 35.0–45.0)
Hemoglobin: 10.5 g/dL — ABNORMAL LOW (ref 11.7–15.5)
LYMPHS PCT: 28 %
Lymphs Abs: 1876 cells/uL (ref 850–3900)
MCH: 24.1 pg — ABNORMAL LOW (ref 27.0–33.0)
MCHC: 29.4 g/dL — AB (ref 32.0–36.0)
MCV: 81.9 fL (ref 80.0–100.0)
MONOS PCT: 8 %
MPV: 10 fL (ref 7.5–12.5)
Monocytes Absolute: 536 cells/uL (ref 200–950)
NEUTROS PCT: 61 %
Neutro Abs: 4087 cells/uL (ref 1500–7800)
PLATELETS: 297 10*3/uL (ref 140–400)
RBC: 4.36 MIL/uL (ref 3.80–5.10)
RDW: 16.8 % — ABNORMAL HIGH (ref 11.0–15.0)
WBC: 6.7 10*3/uL (ref 3.8–10.8)

## 2017-02-26 NOTE — Patient Instructions (Signed)

## 2017-02-26 NOTE — Progress Notes (Signed)
This very nice 66 y.o. DWF presents for 3 month follow up with Hypertension, Hyperlipidemia, Pre-Diabetes and Vitamin D Deficiency.      Patient is treated for HTN (1995) & BP has been controlled at home. Today's BP is at goal -122/80. Patient has had no complaints of any cardiac type chest pain, palpitations, dyspnea/orthopnea/PND, dizziness, claudication, or dependent edema.      Last fall patient had a precipitous  drop in GFR and diuretics were d/c'd and patient was seen in consultation by Dr Camille Bal & dx'd with CKD3. Then Losartan was stopped for postural orthostasis and Dr Eliott Nine is apparently concerned re: occult HPT. Patient has had fluctuating elevated calciums predating back since 1993. Patient's GFR has improved some with the med changes and with restrictions to avoid NSAIDs.      Hyperlipidemia is controlled with diet & meds. Patient denies myalgias or other med SE's. Current Lipids are at goal: Lab Results  Component Value Date   CHOL 111 02/26/2017   HDL 46 (L) 02/26/2017   LDLCALC 40 02/26/2017   TRIG 123 02/26/2017   CHOLHDL 2.4 02/26/2017      Also, the patient has history of  T2_NIDDM w/CKD3 predating since 2006 and has had no symptoms of reactive hypoglycemia, diabetic polys, paresthesias or visual blurring.  Current A1c is near goal: Lab Results  Component Value Date   HGBA1C 5.8 (H) 02/26/2017      Further, the patient also has history of Vitamin D Deficiency and supplements vitamin D without any suspected side-effects. Current  vitamin D is Normal.  Lab Results  Component Value Date   VD25OH 50 02/26/2017   Current Outpatient Prescriptions on File Prior to Visit  Medication Sig  . ALPRAZolam0.5 MG tablet TAKE 1/2-1 TAB 3 x / DAY AS NEEDED  . aspirin EC 81 MG  Take  daily.  Marland Kitchen atenolol  50 MG  TAKE 1 TAB EVERY DAY   . citalopram  40 MG  TAKE 1 TAB DAILY.  . metFORMIN-XR)500 MG  TAKE 1 TO 2 TAB TWICE DAILY WITH FOOD  . Multi-Vit-Mine  Take  daily.  .  rosuvastatin  20 MG TAKE 1 TAB EVERY DAY   Allergies  Allergen Reactions  . Other Other (See Comments)    Unknown anesthetic; unknown reaction; Anesthetic was given during back surgery   PMHx:   Past Medical History:  Diagnosis Date  . Back pain   . Diabetes mellitus without complication (HCC)   . Fibromyalgia   . History of kidney stones   . Hyperlipidemia   . Hypertension   . Type II or unspecified type diabetes mellitus without mention of complication, not stated as uncontrolled   . Vitamin D deficiency    Immunization History  Administered Date(s) Administered  . Influenza Split 08/17/2013, 09/04/2014, 10/20/2016  . Influenza, High Dose Seasonal PF 08/28/2015  . Pneumococcal Conjugate-13 05/29/2014  . Pneumococcal Polysaccharide-23 04/17/2016  . Td 05/06/2006  . Tdap 09/04/2014   Past Surgical History:  Procedure Laterality Date  . INTRAMEDULLARY (IM) NAIL INTERTROCHANTERIC Left 09/29/2013   Procedure: INTRAMEDULLARY (IM) NAIL INTERTROCHANTRIC;  Surgeon: Mable Paris, MD;  Location: [facility] OR;  Service: Orthopedics;  Laterality: Left;   FHx:    Reviewed / unchanged  SHx:    Reviewed / unchanged  Systems Review:  Constitutional: Denies fever, chills, wt changes, headaches, insomnia, fatigue, night sweats, change in appetite. Eyes: Denies redness, blurred vision, diplopia, discharge, itchy, watery eyes.  ENT:  Denies discharge, congestion, post nasal drip, epistaxis, sore throat, earache, hearing loss, dental pain, tinnitus, vertigo, sinus pain, snoring.  CV: Denies chest pain, palpitations, irregular heartbeat, syncope, dyspnea, diaphoresis, orthopnea, PND, claudication or edema. Respiratory: denies cough, dyspnea, DOE, pleurisy, hoarseness, laryngitis, wheezing.  Gastrointestinal: Denies dysphagia, odynophagia, heartburn, reflux, water brash, abdominal pain or cramps, nausea, vomiting, bloating, diarrhea, constipation, hematemesis, melena, hematochezia  or  hemorrhoids. Genitourinary: Denies dysuria, frequency, urgency, nocturia, hesitancy, discharge, hematuria or flank pain. Musculoskeletal: Denies arthralgias, myalgias, stiffness, jt. swelling, pain, limping or strain/sprain.  Skin: Denies pruritus, rash, hives, warts, acne, eczema or change in skin lesion(s). Neuro: No weakness, tremor, incoordination, spasms, paresthesia or pain. Psychiatric: Denies confusion, memory loss or sensory loss. Endo: Denies change in weight, skin or hair change.  Heme/Lymph: No excessive bleeding, bruising or enlarged lymph nodes.  Physical Exam  BP 122/80   Pulse 72   Temp 97.4 F (36.3 C)   Resp 16   Ht 5\' 3"  (1.6 m)   Wt 171 lb (77.6 kg)   BMI 30.29 kg/m   Appears over nourished, well groomed  and in no distress.  Eyes: PERRLA, EOMs, conjunctiva no swelling or erythema. Sinuses: No frontal/maxillary tenderness ENT/Mouth: EAC's clear, TM's nl w/o erythema, bulging. Nares clear w/o erythema, swelling, exudates. Oropharynx clear without erythema or exudates. Oral hygiene is good. Tongue normal, non obstructing. Hearing intact.  Neck: Supple. Thyroid nl. Car 2+/2+ without bruits, nodes or JVD. Chest: Respirations nl with BS clear & equal w/o rales, rhonchi, wheezing or stridor.  Cor: Heart sounds normal w/ regular rate and rhythm without sig. murmurs, gallops, clicks or rubs. Peripheral pulses normal and equal  without edema.  Abdomen: Soft & bowel sounds normal. Non-tender w/o guarding, rebound, hernias, masses or organomegaly.  Lymphatics: Unremarkable.  Musculoskeletal: Full ROM all peripheral extremities, joint stability, 5/5 strength and normal gait.  Skin: Warm, dry without exposed rashes, lesions or ecchymosis apparent.  Neuro: Cranial nerves intact, reflexes equal bilaterally. Sensory-motor testing grossly intact. Tendon reflexes grossly intact.  Pysch: Alert & oriented x 3.  Insight and judgement nl & appropriate. No ideations.  Assessment and  Plan:  1. Essential hypertension  - Continue medication, monitor blood pressure at home.  - Continue DASH diet. Reminder to go to the ER if any CP,  SOB, nausea, dizziness, severe HA, changes vision/speech,  left arm numbness and tingling and jaw pain.  - CBC with Differential/Platelet - BASIC METABOLIC PANEL WITH GFR - Magnesium - TSH  2. Mixed hyperlipidemia  - Continue diet/meds, exercise,& lifestyle modifications.  - Continue monitor periodic cholesterol/liver & renal functions  - Hepatic function panel - Lipid panel - TSH  3. Controlled type 2 diabetes mellitus with stage 3 chronic kidney disease, without long-term current use of insulin (HCC)  - Continue diet, exercise, lifestyle modifications.  - Monitor appropriate labs. - continue close f/u w/ Dr Eliott Nine  - Hemoglobin A1c - Insulin, random  4. Vitamin D deficiency  - Continue supplementation.  - VITAMIN D 25 Hydroxy   5. Medication management   - CBC with Differential/Platelet - BASIC METABOLIC PANEL WITH GFR - Hepatic function panel - Magnesium - Lipid panel - TSH - Hemoglobin A1c - Insulin, random - VITAMIN D 25 Hydroxy        Discussed  regular exercise, BP monitoring, weight control to achieve/maintain BMI less than 25 and discussed med and SE's. Recommended labs to assess and monitor clinical status with further disposition pending results of labs. Over 30  minutes of exam, counseling, chart review was performed.

## 2017-02-27 ENCOUNTER — Encounter: Payer: Self-pay | Admitting: Internal Medicine

## 2017-02-27 LAB — VITAMIN D 25 HYDROXY (VIT D DEFICIENCY, FRACTURES): VIT D 25 HYDROXY: 50 ng/mL (ref 30–100)

## 2017-02-27 LAB — MAGNESIUM: Magnesium: 1.5 mg/dL (ref 1.5–2.5)

## 2017-02-27 LAB — INSULIN, RANDOM: Insulin: 7.7 u[IU]/mL (ref 2.0–19.6)

## 2017-02-27 LAB — HEMOGLOBIN A1C
Hgb A1c MFr Bld: 5.8 % — ABNORMAL HIGH (ref <5.7)
Mean Plasma Glucose: 120 mg/dL

## 2017-02-27 LAB — TSH: TSH: 1.92 mIU/L

## 2017-03-09 ENCOUNTER — Other Ambulatory Visit: Payer: Self-pay | Admitting: Physician Assistant

## 2017-04-11 ENCOUNTER — Other Ambulatory Visit: Payer: Self-pay | Admitting: Internal Medicine

## 2017-04-28 ENCOUNTER — Other Ambulatory Visit: Payer: Self-pay | Admitting: Internal Medicine

## 2017-05-01 DEATH — deceased

## 2017-06-01 ENCOUNTER — Ambulatory Visit: Payer: Self-pay | Admitting: Physician Assistant

## 2017-08-04 ENCOUNTER — Encounter: Payer: Self-pay | Admitting: Physician Assistant

## 2017-08-27 ENCOUNTER — Encounter: Payer: Self-pay | Admitting: Physician Assistant

## 2017-09-01 ENCOUNTER — Encounter: Payer: Self-pay | Admitting: Physician Assistant
